# Patient Record
Sex: Male | Born: 1955
Health system: Southern US, Community
[De-identification: ages and names within clinical notes are randomized; demographics above are authoritative.]

## PROBLEM LIST (undated history)

## (undated) DIAGNOSIS — T7840XA Allergy, unspecified, initial encounter: Secondary | ICD-10-CM

## (undated) DIAGNOSIS — R42 Dizziness and giddiness: Secondary | ICD-10-CM

## (undated) HISTORY — PX: SCAR REVISION: SHX5285

## (undated) HISTORY — PX: TONSILLECTOMY: SUR1361

## (undated) HISTORY — DX: Allergy, unspecified, initial encounter: T78.40XA

---
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
## 2014-09-20 DIAGNOSIS — E66811 Obesity, class 1: Secondary | ICD-10-CM

## 2014-09-20 DIAGNOSIS — E669 Obesity, unspecified: Secondary | ICD-10-CM

## 2014-09-20 DIAGNOSIS — Z87828 Personal history of other (healed) physical injury and trauma: Secondary | ICD-10-CM

## 2014-09-20 HISTORY — DX: Obesity, class 1: E66.811

## 2014-09-20 HISTORY — DX: Personal history of other (healed) physical injury and trauma: Z87.828

## 2014-09-20 HISTORY — DX: Obesity, unspecified: E66.9

## 2015-10-04 DIAGNOSIS — E559 Vitamin D deficiency, unspecified: Secondary | ICD-10-CM

## 2015-10-04 HISTORY — DX: Vitamin D deficiency, unspecified: E55.9

## 2016-10-08 DIAGNOSIS — Z720 Tobacco use: Secondary | ICD-10-CM

## 2016-10-08 HISTORY — DX: Tobacco use: Z72.0

## 2016-10-09 DIAGNOSIS — Z79899 Other long term (current) drug therapy: Secondary | ICD-10-CM | POA: Insufficient documentation

## 2016-10-09 HISTORY — DX: Other long term (current) drug therapy: Z79.899

## 2019-03-15 LAB — HM COLONOSCOPY

## 2019-03-16 DIAGNOSIS — Z86010 Personal history of colonic polyps: Secondary | ICD-10-CM

## 2019-03-16 DIAGNOSIS — Z860101 Personal history of adenomatous and serrated colon polyps: Secondary | ICD-10-CM

## 2019-03-16 HISTORY — DX: Personal history of colonic polyps: Z86.010

## 2019-03-16 HISTORY — DX: Personal history of adenomatous and serrated colon polyps: Z86.0101

## 2019-06-27 ENCOUNTER — Encounter: Payer: Self-pay | Admitting: Family Medicine

## 2019-06-27 ENCOUNTER — Ambulatory Visit (INDEPENDENT_AMBULATORY_CARE_PROVIDER_SITE_OTHER): Payer: 59 | Admitting: Family Medicine

## 2019-06-27 ENCOUNTER — Other Ambulatory Visit: Payer: Self-pay

## 2019-06-27 VITALS — BP 111/77 | HR 84 | Ht 70.0 in | Wt 236.0 lb

## 2019-06-27 DIAGNOSIS — R7989 Other specified abnormal findings of blood chemistry: Secondary | ICD-10-CM

## 2019-06-27 DIAGNOSIS — Z720 Tobacco use: Secondary | ICD-10-CM

## 2019-06-27 DIAGNOSIS — E785 Hyperlipidemia, unspecified: Secondary | ICD-10-CM

## 2019-06-27 DIAGNOSIS — Z125 Encounter for screening for malignant neoplasm of prostate: Secondary | ICD-10-CM

## 2019-06-27 DIAGNOSIS — E559 Vitamin D deficiency, unspecified: Secondary | ICD-10-CM | POA: Diagnosis not present

## 2019-06-27 DIAGNOSIS — E782 Mixed hyperlipidemia: Secondary | ICD-10-CM | POA: Diagnosis not present

## 2019-06-27 DIAGNOSIS — Z86010 Personal history of colonic polyps: Secondary | ICD-10-CM | POA: Diagnosis not present

## 2019-06-27 DIAGNOSIS — Z87828 Personal history of other (healed) physical injury and trauma: Secondary | ICD-10-CM

## 2019-06-27 DIAGNOSIS — Z0000 Encounter for general adult medical examination without abnormal findings: Secondary | ICD-10-CM

## 2019-06-27 HISTORY — DX: Hyperlipidemia, unspecified: E78.5

## 2019-06-27 LAB — COMPLETE METABOLIC PANEL WITH GFR
AG Ratio: 1.8 (calc) (ref 1.0–2.5)
ALT: 18 U/L (ref 9–46)
AST: 13 U/L (ref 10–35)
Albumin: 4.4 g/dL (ref 3.6–5.1)
Alkaline phosphatase (APISO): 45 U/L (ref 35–144)
BUN: 14 mg/dL (ref 7–25)
CO2: 25 mmol/L (ref 20–32)
Calcium: 9.5 mg/dL (ref 8.6–10.3)
Chloride: 103 mmol/L (ref 98–110)
Creat: 0.78 mg/dL (ref 0.70–1.25)
GFR, Est African American: 112 mL/min/{1.73_m2} (ref >=60)
GFR, Est Non African American: 97 mL/min/{1.73_m2} (ref >=60)
Globulin: 2.4 g/dL (calc) (ref 1.9–3.7)
Glucose, Bld: 85 mg/dL (ref 65–99)
Potassium: 4.1 mmol/L (ref 3.5–5.3)
Sodium: 140 mmol/L (ref 135–146)
Total Bilirubin: 0.7 mg/dL (ref 0.2–1.2)
Total Protein: 6.8 g/dL (ref 6.1–8.1)

## 2019-06-27 LAB — CBC
HCT: 47 % (ref 38.5–50.0)
Hemoglobin: 16.1 g/dL (ref 13.2–17.1)
MCH: 29.6 pg (ref 27.0–33.0)
MCHC: 34.3 g/dL (ref 32.0–36.0)
MCV: 86.4 fL (ref 80.0–100.0)
MPV: 10.6 fL (ref 7.5–12.5)
Platelets: 189 10*3/uL (ref 140–400)
RBC: 5.44 10*6/uL (ref 4.20–5.80)
RDW: 13.1 % (ref 11.0–15.0)
WBC: 8.4 10*3/uL (ref 3.8–10.8)

## 2019-06-27 LAB — VITAMIN D 25 HYDROXY (VIT D DEFICIENCY, FRACTURES): Vit D, 25-Hydroxy: 33 ng/mL (ref 30–100)

## 2019-06-27 LAB — LIPID PANEL
Cholesterol: 192 mg/dL (ref <200)
HDL: 40 mg/dL (ref >=40)
LDL Cholesterol (Calc): 119 mg/dL (calc) — ABNORMAL HIGH
Non-HDL Cholesterol (Calc): 152 mg/dL (calc) — ABNORMAL HIGH (ref <130)
Total CHOL/HDL Ratio: 4.8 (calc) (ref <5.0)
Triglycerides: 213 mg/dL — ABNORMAL HIGH (ref <150)

## 2019-06-27 LAB — PSA: PSA: 0.4 ng/mL (ref <=4.0)

## 2019-06-27 NOTE — Assessment & Plan Note (Signed)
Continue work on [employer] and regular exercise.  Due to recheck lipids.  Currently on a statin and tolerating well.

## 2019-06-27 NOTE — Assessment & Plan Note (Signed)
He has been on vitamin D 2000 IU for almost a year.  He was previously on a lower dose but his vitamin D was not quite therapeutic.  He would like to have that rechecked.
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
## 2019-06-27 NOTE — Assessment & Plan Note (Signed)
Gets colonoscopies every 3 years with Dr. Sophronia Simas.

## 2019-06-27 NOTE — Assessment & Plan Note (Signed)
Still occasionally chews Nicorette gum but does not smoke and has not since 2012.

## 2019-06-28 ENCOUNTER — Encounter: Payer: Self-pay | Admitting: Family Medicine

## 2019-07-04 ENCOUNTER — Telehealth: Payer: Self-pay

## 2019-07-04 ENCOUNTER — Encounter: Payer: Self-pay | Admitting: Family Medicine

## 2019-07-04 ENCOUNTER — Ambulatory Visit (INDEPENDENT_AMBULATORY_CARE_PROVIDER_SITE_OTHER): Payer: 59 | Admitting: Family Medicine

## 2019-07-04 ENCOUNTER — Other Ambulatory Visit: Payer: Self-pay

## 2019-07-04 VITALS — BP 103/65 | HR 82 | Ht 70.0 in | Wt 234.0 lb

## 2019-07-04 DIAGNOSIS — Z0000 Encounter for general adult medical examination without abnormal findings: Secondary | ICD-10-CM | POA: Diagnosis not present

## 2019-07-04 DIAGNOSIS — Z1159 Encounter for screening for other viral diseases: Secondary | ICD-10-CM | POA: Diagnosis not present

## 2019-07-04 DIAGNOSIS — M6208 Separation of muscle (nontraumatic), other site: Secondary | ICD-10-CM

## 2019-07-04 DIAGNOSIS — Z0184 Encounter for antibody response examination: Secondary | ICD-10-CM | POA: Diagnosis not present

## 2019-07-04 DIAGNOSIS — Z298 Encounter for other specified prophylactic measures: Secondary | ICD-10-CM

## 2019-07-04 DIAGNOSIS — Z23 Encounter for immunization: Secondary | ICD-10-CM | POA: Diagnosis not present

## 2019-07-04 DIAGNOSIS — Z7184 Encounter for health counseling related to travel: Secondary | ICD-10-CM

## 2019-07-04 HISTORY — DX: Separation of muscle (nontraumatic), other site: M62.08

## 2019-07-04 MED ORDER — ATOVAQUONE-PROGUANIL HCL 62.5-25 MG PO TABS: 1.0000 | ORAL_TABLET | Freq: Every day | ORAL | 0 refills | Status: DC

## 2019-07-04 MED ORDER — TYPHOID VACCINE PO CPDR: 1.0000 | DELAYED_RELEASE_CAPSULE | ORAL | 0 refills | Status: DC

## 2019-07-04 NOTE — Patient Instructions (Signed)

## 2019-07-04 NOTE — Telephone Encounter (Signed)
The typhoid Vivotif is no longer available. They do have the typhoid Typhim. Pended order. It reads a 10 ml vial.
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
## 2019-07-05 ENCOUNTER — Encounter: Payer: Self-pay | Admitting: Family Medicine

## 2019-07-05 LAB — HIV ANTIBODY (ROUTINE TESTING W REFLEX): HIV 1&2 Ab, 4th Generation: NONREACTIVE

## 2019-07-05 LAB — HEPATITIS C ANTIBODY
Hepatitis C Ab: NONREACTIVE
SIGNAL TO CUT-OFF: 0.01 (ref <1.00)

## 2019-07-05 LAB — MEASLES/MUMPS/RUBELLA IMMUNITY
Mumps IgG: 228 AU/mL
Rubella: 27.6 Index
Rubeola IgG: >300 AU/mL

## 2019-07-05 LAB — VARICELLA ZOSTER ANTIBODY, IGG: Varicella IgG: 610.6 index

## 2019-07-05 MED ORDER — ATOVAQUONE-PROGUANIL HCL 250-100 MG PO TABS: ORAL_TABLET | ORAL | 0 refills | Status: DC

## 2019-07-05 MED FILL — ATOVAQUONE-PROGUANIL 250-10: 250-100 | 20 days supply | Qty: 20 | Fill #0

## 2019-07-05 NOTE — Telephone Encounter (Signed)
Called and spoke with Romeo Apple at pharmacy, they have correct dose and medication. No further needs.

## 2019-07-05 NOTE — Telephone Encounter (Signed)
Please see pended rx. This is the only choice I see from the list of options.

## 2019-07-05 NOTE — Telephone Encounter (Signed)
The pharmacy would like to switch the Malarone to the adult dose of 250/100 mg.   Disregard first message.

## 2019-07-11 ENCOUNTER — Other Ambulatory Visit: Payer: Self-pay

## 2019-07-11 ENCOUNTER — Ambulatory Visit (INDEPENDENT_AMBULATORY_CARE_PROVIDER_SITE_OTHER): Payer: 59 | Admitting: Physician Assistant

## 2019-07-11 VITALS — Temp 98.9°F

## 2019-07-11 DIAGNOSIS — Z23 Encounter for immunization: Secondary | ICD-10-CM | POA: Diagnosis not present

## 2019-07-11 NOTE — Progress Notes (Signed)
Established Patient Office Visit  Subjective:  Patient ID: Luis Mullins, male    DOB: [birth year]  Age: 64 y.o. MRN: [id number]  CC:  Chief Complaint  Patient presents with  . Immunizations    HPI Kasper Mudrick presents for TwinRix injection.  History reviewed. No pertinent past medical history.  Past Surgical History:  Procedure Laterality Date  . APPENDECTOMY  1975  . SCAR REVISION    . TONSILLECTOMY     age 56    Family History  Problem Relation Age of Onset  . Hypercholesterolemia Mother   . Hypercholesterolemia Father   . Heart attack Maternal Grandfather   . Prostate cancer Paternal Uncle     Social History   Socioeconomic History  . Marital status: Married    Spouse name: Juliann Pulse  . Number of children: Not on file  . Years of education: Not on file  . Highest education level: Not on file  Occupational History  . Occupation: retired  Tobacco Use  . Smoking status: Former Smoker    Types: Cigarettes    Quit date: 01/14/2019    Years since quitting: 0.4  . Smokeless tobacco: Never Used  Vaping Use  . Vaping Use: Never used  Substance and Sexual Activity  . Alcohol use: Never  . Drug use: Never  . Sexual activity: Yes    Partners: Female    Birth control/protection: None  Other Topics Concern  . Not on file  Social History Narrative   Retired.   Social Determinants of Health   Financial Resource Strain:   . Difficulty of Paying Living Expenses:   Food Insecurity: No Food Insecurity  . Worried About Charity fundraiser in the Last Year: Never true  . Ran Out of Food in the Last Year: Never true  Transportation Needs: No Transportation Needs  . Lack of Transportation (Medical): No  . Lack of Transportation (Non-Medical): No  Physical Activity: Insufficiently Active  . Days of Exercise per Week: 1 day  . Minutes of Exercise per Session: 10 min  Stress:   . Feeling of Stress :   Social Connections: Unknown  . Frequency of Communication  with Friends and Family: Not on file  . Frequency of Social Gatherings with Friends and Family: Not on file  . Attends Religious Services: Not on file  . Active Member of Clubs or Organizations: Not on file  . Attends Archivist Meetings: Not on file  . Marital Status: Married  Human resources officer Violence: Not At Risk  . Fear of Current or Ex-Partner: No  . Emotionally Abused: No  . Physically Abused: No  . Sexually Abused: No    Outpatient Medications Prior to Visit  Medication Sig Dispense Refill  . Cholecalciferol 25 MCG (1000 UT) capsule Take 2,000 Units by mouth daily.    . nicotine polacrilex (NICORETTE) 2 MG gum Take by mouth as needed.    . sildenafil (REVATIO) 20 MG tablet Take 2-3 tablets 30-60 minutes prior to sexual activity.    . simvastatin (ZOCOR) 5 MG tablet Take 1 tablet by mouth at bedtime.    Marland Kitchen atovaquone-proguanil (MALARONE) 250-100 MG TABS tablet Take 1 tablet by mouth daily. Start medication 1 to 2 days before exposure/travel. Take 1 tab daily. Continue for 7 days after you return to the [place] (Patient not taking: Reported on 07/11/2019) 20 tablet 0   No facility-administered medications prior to visit.    Allergies  Allergen Reactions  . Bee Venom  ROS Review of Systems    Objective:    Physical Exam  Temp 98.9 F (37.2 C) (Oral)  Wt Readings from Last 3 Encounters:  07/04/19 234 lb (106.1 kg)  06/27/19 236 lb (107 kg)     There are no preventive care reminders to display for this patient.  There are no preventive care reminders to display for this patient.  No results found for: TSH Lab Results  Component Value Date   WBC 8.4 06/27/2019   HGB 16.1 06/27/2019   HCT 47.0 06/27/2019   MCV 86.4 06/27/2019   PLT 189 06/27/2019   Lab Results  Component Value Date   NA 140 06/27/2019   K 4.1 06/27/2019   CO2 25 06/27/2019   GLUCOSE 85 06/27/2019   BUN 14 06/27/2019   CREATININE 0.78 06/27/2019   BILITOT 0.7 06/27/2019   AST 13  06/27/2019   ALT 18 06/27/2019   PROT 6.8 06/27/2019   CALCIUM 9.5 06/27/2019   Lab Results  Component Value Date   CHOL 192 06/27/2019   Lab Results  Component Value Date   HDL 40 06/27/2019   Lab Results  Component Value Date   LDLCALC 119 (H) 06/27/2019   Lab Results  Component Value Date   TRIG 213 (H) 06/27/2019   Lab Results  Component Value Date   CHOLHDL 4.8 06/27/2019   No results found for: HGBA1C    Assessment & Plan:  TwinRix vaccine - Patient tolerated injection well without complications. Patient advised to schedule next injection 14 days from today. TwinRix schedule - Days 0, 7, and 21 to 30 followed by a booster dose at Month 12 may be used.    Problem List Items Addressed This Visit    None    Visit Diagnoses    Need for prophylactic vaccination against hepatitis A and hepatitis B in adult    -  Primary   Relevant Orders   Hepatitis A hepatitis B combined vaccine IM (Completed)      No orders of the defined types were placed in this encounter.   Follow-up: Return in about 2 weeks (around 07/25/2019) for Twinrix injection. Earna Coder, Janalyn Harder, CMA

## 2019-07-11 NOTE — Progress Notes (Signed)
Patient ID: Luis Mullins, male   DOB: Sep 09, 1955, 64 y.o.   MRN: [id number] Agree with above plan.

## 2019-07-25 ENCOUNTER — Other Ambulatory Visit: Payer: Self-pay

## 2019-07-25 ENCOUNTER — Ambulatory Visit (INDEPENDENT_AMBULATORY_CARE_PROVIDER_SITE_OTHER): Payer: 59 | Admitting: Family Medicine

## 2019-07-25 ENCOUNTER — Encounter: Payer: Self-pay | Admitting: Family Medicine

## 2019-07-25 ENCOUNTER — Encounter: Payer: Self-pay | Admitting: Physician Assistant

## 2019-07-25 VITALS — Temp 97.0°F

## 2019-07-25 DIAGNOSIS — Z23 Encounter for immunization: Secondary | ICD-10-CM

## 2019-07-25 NOTE — Progress Notes (Signed)
TwinRix vaccine - Patient tolerated injection given in Left left deltoid  well without complications.

## 2019-07-25 NOTE — Progress Notes (Signed)
Agree with documentation as above.   Saarah Dewing, MD  

## 2019-07-26 NOTE — Telephone Encounter (Signed)
Would this be something he could get at the health department?

## 2019-07-28 ENCOUNTER — Encounter: Payer: 59 | Admitting: Family Medicine

## 2019-08-06 ENCOUNTER — Encounter: Payer: Self-pay | Admitting: Family Medicine

## 2019-08-10 NOTE — Telephone Encounter (Signed)
Immunization record updated in pt's chart.

## 2019-08-17 DIAGNOSIS — Z20822 Contact with and (suspected) exposure to covid-19: Secondary | ICD-10-CM | POA: Diagnosis not present

## 2019-09-01 DIAGNOSIS — Z20822 Contact with and (suspected) exposure to covid-19: Secondary | ICD-10-CM | POA: Diagnosis not present

## 2019-10-24 ENCOUNTER — Other Ambulatory Visit: Payer: Self-pay

## 2019-10-24 ENCOUNTER — Ambulatory Visit (INDEPENDENT_AMBULATORY_CARE_PROVIDER_SITE_OTHER): Payer: 59 | Admitting: Family Medicine

## 2019-10-24 VITALS — BP 107/72 | HR 76

## 2019-10-24 DIAGNOSIS — Z23 Encounter for immunization: Secondary | ICD-10-CM

## 2019-10-24 NOTE — Progress Notes (Signed)
Patient is here for a flu vaccine. Verified no previous allergy to flu vaccine, eggs, or latex. Flu injection to right deltoid with no apparent complications. Patient advised to call with any problems.   

## 2019-12-06 ENCOUNTER — Encounter: Payer: Self-pay | Admitting: Family Medicine

## 2019-12-06 DIAGNOSIS — E782 Mixed hyperlipidemia: Secondary | ICD-10-CM

## 2019-12-07 ENCOUNTER — Other Ambulatory Visit: Payer: Self-pay | Admitting: Family Medicine

## 2019-12-07 MED ORDER — SIMVASTATIN 5 MG PO TABS: 5.0000 mg | ORAL_TABLET | Freq: Every day | ORAL | 2 refills | Status: DC

## 2019-12-07 MED FILL — SIMVASTATIN 5 MG TABS: 5 | 90 days supply | Qty: 90 | Fill #0

## 2019-12-07 NOTE — Telephone Encounter (Signed)
rx sent

## 2019-12-26 ENCOUNTER — Encounter: Payer: Self-pay | Admitting: Physician Assistant

## 2020-01-09 ENCOUNTER — Telehealth: Payer: Self-pay | Admitting: Family Medicine

## 2020-01-09 MED ORDER — PREDNISONE 20 MG PO TABS: 40.0000 mg | ORAL_TABLET | Freq: Every day | ORAL | 0 refills | Status: DC

## 2020-01-09 MED ORDER — CYCLOBENZAPRINE HCL 10 MG PO TABS: 10.0000 mg | ORAL_TABLET | Freq: Three times a day (TID) | ORAL | 0 refills | Status: DC | PRN

## 2020-01-09 NOTE — Telephone Encounter (Signed)
Pulled out his back while traveling to [place].  Low back hurting.  He is out of out town. rx for steroid and muscle relaxer sent in. If not better next week please make appt for low back pain.

## 2020-01-23 ENCOUNTER — Encounter: Payer: Self-pay | Admitting: Family Medicine

## 2020-01-23 ENCOUNTER — Ambulatory Visit (INDEPENDENT_AMBULATORY_CARE_PROVIDER_SITE_OTHER): Payer: 59 | Admitting: Family Medicine

## 2020-01-23 ENCOUNTER — Other Ambulatory Visit: Payer: Self-pay

## 2020-01-23 ENCOUNTER — Ambulatory Visit (INDEPENDENT_AMBULATORY_CARE_PROVIDER_SITE_OTHER): Payer: 59

## 2020-01-23 VITALS — BP 119/84 | HR 102 | Ht 70.0 in | Wt 246.0 lb

## 2020-01-23 DIAGNOSIS — M25512 Pain in left shoulder: Secondary | ICD-10-CM

## 2020-01-23 DIAGNOSIS — M19012 Primary osteoarthritis, left shoulder: Secondary | ICD-10-CM | POA: Diagnosis not present

## 2020-01-23 DIAGNOSIS — M545 Low back pain, unspecified: Secondary | ICD-10-CM

## 2020-01-23 DIAGNOSIS — R0683 Snoring: Secondary | ICD-10-CM

## 2020-01-23 HISTORY — DX: Low back pain, unspecified: M54.50

## 2020-01-23 HISTORY — DX: Pain in left shoulder: M25.512

## 2020-01-23 HISTORY — DX: Snoring: R06.83

## 2020-01-23 NOTE — Assessment & Plan Note (Signed)
Has been recurrent for years he had an old injury about 25 years ago lifting heavy dye cast.  He says his last flare when he was traveling is about 90 for 5% improved.  He is doing better.  He actually points to the right SI joint area as his area of localized pain.

## 2020-01-23 NOTE — Assessment & Plan Note (Signed)
Suspect bursitis or hooked acromion causing pain with extremes of range of motion though he does have normal range of motion and strength which is great.  Given exercises to do on his own at home.  We will go ahead and get x-ray today since symptoms have been present for at least 6 months.

## 2020-01-23 NOTE — Progress Notes (Signed)
Established Patient Office Visit  Subjective:  Patient ID: Luis Mullins, male    DOB: [birth year]  Age: 64 y.o. MRN: [id number]  CC:  Chief Complaint  Patient presents with  . Snoring       . Shoulder Pain    HPI Luis Mullins presents for back pain.  He had called in December while traveling reporting that he had pulled out his back.  We did send Flexeril and prednisone.  He says it did help in fact he is about 95% better but he does have a couple other concerns today.  Wife reports that he has been snoring.  He does snore loudly he has had witnessed apnea and he is over 45 years old.  He also reports left shoulder pain x 6 months.   History reviewed. No pertinent past medical history.  Past Surgical History:  Procedure Laterality Date  . APPENDECTOMY  1975  . SCAR REVISION    . TONSILLECTOMY     age 43    Family History  Problem Relation Age of Onset  . Hypercholesterolemia Mother   . Hypercholesterolemia Father   . Heart attack Maternal Grandfather   . Prostate cancer Paternal Uncle     Social History   Socioeconomic History  . Marital status: Married    Spouse name: [name]  . Number of children: Not on file  . Years of education: Not on file  . Highest education level: Not on file  Occupational History  . Occupation: retired  Tobacco Use  . Smoking status: Former Smoker    Types: Cigarettes    Quit date: 01/14/2019    Years since quitting: 1.0  . Smokeless tobacco: Never Used  Vaping Use  . Vaping Use: Never used  Substance and Sexual Activity  . Alcohol use: Never  . Drug use: Never  . Sexual activity: Yes    Partners: Female    Birth control/protection: None  Other Topics Concern  . Not on file  Social History Narrative   Retired.   Social Determinants of Health   Financial Resource Strain: Not on file  Food Insecurity: No Food Insecurity  . Worried About Programme researcher, broadcasting/film/video in the Last Year: Never true  . Ran Out of Food in the Last  Year: Never true  Transportation Needs: No Transportation Needs  . Lack of Transportation (Medical): No  . Lack of Transportation (Non-Medical): No  Physical Activity: Insufficiently Active  . Days of Exercise per Week: 1 day  . Minutes of Exercise per Session: 10 min  Stress: Not on file  Social Connections: Unknown  . Frequency of Communication with Friends and Family: Not on file  . Frequency of Social Gatherings with Friends and Family: Not on file  . Attends Religious Services: Not on file  . Active Member of Clubs or Organizations: Not on file  . Attends Banker Meetings: Not on file  . Marital Status: Married  Catering manager Violence: Not At Risk  . Fear of Current or Ex-Partner: No  . Emotionally Abused: No  . Physically Abused: No  . Sexually Abused: No    Outpatient Medications Prior to Visit  Medication Sig Dispense Refill  . Cholecalciferol 25 MCG (1000 UT) capsule Take 2,000 Units by mouth daily.    . nicotine polacrilex (NICORETTE) 2 MG gum Take by mouth as needed.    . sildenafil (REVATIO) 20 MG tablet Take 2-3 tablets 30-60 minutes prior to sexual activity.    . simvastatin (  ZOCOR) 5 MG tablet Take 1 tablet (5 mg total) by mouth at bedtime. 90 tablet 2  . cyclobenzaprine (FLEXERIL) 10 MG tablet Take 1 tablet (10 mg total) by mouth 3 (three) times daily as needed for muscle spasms. 30 tablet 0  . predniSONE (DELTASONE) 20 MG tablet Take 2 tablets (40 mg total) by mouth daily with breakfast. 10 tablet 0   No facility-administered medications prior to visit.    Allergies  Allergen Reactions  . Bee Venom     ROS Review of Systems    Objective:    Physical Exam Vitals reviewed.  Constitutional:      Appearance: He is well-developed and well-nourished.  HENT:     Head: Normocephalic and atraumatic.  Eyes:     Extraocular Movements: EOM normal.     Conjunctiva/sclera: Conjunctivae normal.  Cardiovascular:     Rate and Rhythm: Normal rate.   Pulmonary:     Effort: Pulmonary effort is normal.  Musculoskeletal:     Comments: Left shoulder is nontender.  Normal range of motion.  Negative empty can test.  Slightly decreased external rotation on the left compared to the right.  Skin:    General: Skin is dry.     Coloration: Skin is not pale.  Neurological:     Mental Status: He is alert and oriented to person, place, and time.  Psychiatric:        Mood and Affect: Mood and affect normal.        Behavior: Behavior normal.     BP 119/84   Pulse (!) 102   Ht 5\' 10"  (1.778 m)   Wt 246 lb (111.6 kg)   SpO2 94%   BMI 35.30 kg/m  Wt Readings from Last 3 Encounters:  01/23/20 246 lb (111.6 kg)  07/04/19 234 lb (106.1 kg)  06/27/19 236 lb (107 kg)     There are no preventive care reminders to display for this patient.  There are no preventive care reminders to display for this patient.  No results found for: TSH Lab Results  Component Value Date   WBC 8.4 06/27/2019   HGB 16.1 06/27/2019   HCT 47.0 06/27/2019   MCV 86.4 06/27/2019   PLT 189 06/27/2019   Lab Results  Component Value Date   NA 140 06/27/2019   K 4.1 06/27/2019   CO2 25 06/27/2019   GLUCOSE 85 06/27/2019   BUN 14 06/27/2019   CREATININE 0.78 06/27/2019   BILITOT 0.7 06/27/2019   AST 13 06/27/2019   ALT 18 06/27/2019   PROT 6.8 06/27/2019   CALCIUM 9.5 06/27/2019   Lab Results  Component Value Date   CHOL 192 06/27/2019   Lab Results  Component Value Date   HDL 40 06/27/2019   Lab Results  Component Value Date   LDLCALC 119 (H) 06/27/2019   Lab Results  Component Value Date   TRIG 213 (H) 06/27/2019   Lab Results  Component Value Date   CHOLHDL 4.8 06/27/2019   No results found for: HGBA1C    Assessment & Plan:   Problem List Items Addressed This Visit      Other   Snoring    Stop bang score of 5 today suspicious enough that we do need to test him for obstructive sleep apnea did warn about potential side effects and  long-term effects of having untreated sleep apnea.  Discussed options including home test versus in lab study.  He would prefer to do an in lab  study.  Referral placed.      Relevant Orders   Split night study   Acute right-sided low back pain without sciatica    Has been recurrent for years he had an old injury about 25 years ago lifting heavy dye cast.  He says his last flare when he was traveling is about 90 for 5% improved.  He is doing better.  He actually points to the right SI joint area as his area of localized pain.      Acute pain of left shoulder - Primary    Suspect bursitis or hooked acromion causing pain with extremes of range of motion though he does have normal range of motion and strength which is great.  Given exercises to do on his own at home.  We will go ahead and get x-ray today since symptoms have been present for at least 6 months.      Relevant Orders   DG Shoulder Left      No orders of the defined types were placed in this encounter.   Follow-up: No follow-ups on file.    Nani Gasser, MD

## 2020-01-23 NOTE — Assessment & Plan Note (Signed)
Stop bang score of 5 today suspicious enough that we do need to test him for obstructive sleep apnea did warn about potential side effects and long-term effects of having untreated sleep apnea.  Discussed options including home test versus in lab study.  He would prefer to do an in lab study.  Referral placed.

## 2020-03-07 ENCOUNTER — Other Ambulatory Visit: Payer: Self-pay

## 2020-03-07 ENCOUNTER — Ambulatory Visit (HOSPITAL_BASED_OUTPATIENT_CLINIC_OR_DEPARTMENT_OTHER): Payer: 59 | Attending: Family Medicine | Admitting: Internal Medicine

## 2020-03-07 VITALS — Ht 70.0 in | Wt 235.0 lb

## 2020-03-07 DIAGNOSIS — R0683 Snoring: Secondary | ICD-10-CM | POA: Diagnosis present

## 2020-03-07 DIAGNOSIS — G4761 Periodic limb movement disorder: Secondary | ICD-10-CM | POA: Diagnosis not present

## 2020-03-07 DIAGNOSIS — G4733 Obstructive sleep apnea (adult) (pediatric): Secondary | ICD-10-CM | POA: Insufficient documentation

## 2020-03-11 DIAGNOSIS — R0683 Snoring: Secondary | ICD-10-CM | POA: Diagnosis not present

## 2020-03-11 NOTE — Procedures (Signed)
    Patient Name: Luis Mullins, Luis Mullins Date: 03/07/2020 Gender: Male D.O.B: [birth year] Age (years): 64 Referring Provider: Nani Gasser Height (inches): 70 Interpreting Physician: Jetty Duhamel MD, ABSM Weight (lbs): 235 RPSGT: Shelah Lewandowsky BMI: 34 MRN: [id number] Neck Size: 18.50  CLINICAL INFORMATION Sleep Study Type: NPSG Indication for sleep study: Obesity, Snoring, Witnessed Apneas Epworth Sleepiness Score: 3  SLEEP STUDY TECHNIQUE As per the AASM Manual for the Scoring of Sleep and Associated Events v2.3 (April 2016) with a hypopnea requiring 4% desaturations.  The channels recorded and monitored were frontal, central and occipital EEG, electrooculogram (EOG), submentalis EMG (chin), nasal and oral airflow, thoracic and abdominal wall motion, anterior tibialis EMG, snore microphone, electrocardiogram, and pulse oximetry.  MEDICATIONS Medications self-administered by patient taken the night of the study : none reported  SLEEP ARCHITECTURE The study was initiated at 10:43:15 PM and ended at 5:07:19 AM.  Sleep onset time was 25.8 minutes and the sleep efficiency was 79.9%%. The total sleep time was 307 minutes.  Stage REM latency was 149.0 minutes.  The patient spent 3.4%% of the night in stage N1 sleep, 78.8%% in stage N2 sleep, 0.2%% in stage N3 and 17.6% in REM.  Alpha intrusion was absent.  Supine sleep was 29.86%.  RESPIRATORY PARAMETERS The overall apnea/hypopnea index (AHI) was 4.1 per hour. There were 0 total apneas, including 0 obstructive, 0 central and 0 mixed apneas. There were 21 hypopneas and 10 RERAs.  The AHI during Stage REM sleep was 4.4 per hour.  AHI while supine was 10.5 per hour.  The mean oxygen saturation was 92.3%. The minimum SpO2 during sleep was 88.0%.  moderate snoring was noted during this study.  CARDIAC DATA The 2 lead EKG demonstrated sinus rhythm. The mean heart rate was 76.5 beats per minute. Other EKG findings  include: None.  LEG MOVEMENT DATA The total PLMS were 0 with a resulting PLMS index of 0.0. Associated arousal with leg movement index was 2.3 .  IMPRESSIONS - Obstructive sleep apnea events occurred during this study, within normal limits (AHI = 4.1/h). - No significant central sleep apnea occurred during this study (CAI = 0.0/h). - The patient had mild oxygen desaturation during the study (Min O2 = 88.0%) Mean O2 saturation 92.4%. - The patient snored with moderate snoring volume. - No cardiac abnormalities were noted during this study. - Limb Movement sleep disturbance was noted with total 765 limb movements (149.5/ hr). 12 (2.3/ hr) were associated with arousal or awakening.  R>L leg. DIAGNOSIS - Limb Movement Sleep Disorder  RECOMMENDATIONS - Consider therapeutic trial of Requip, Mirapex or carbidopa-levodopa for limb movement. - Sleep hygiene should be reviewed to assess factors that may improve sleep quality. - Weight management and regular exercise should be initiated or continued if appropriate.  [Electronically signed] 03/11/2020 11:45 AM  Jetty Duhamel MD, ABSM Diplomate, American Board of Sleep Medicine   NPI: [id number]                         Jetty Duhamel Diplomate, American Board of Sleep Medicine  ELECTRONICALLY SIGNED ON:  03/11/2020, 11:39 AM [facility] PH: [phone]   FX: [phone] ACCREDITED BY THE AMERICAN ACADEMY OF SLEEP MEDICINE

## 2020-04-02 MED FILL — SIMVASTATIN 5 MG TABS: 5 | 90 days supply | Qty: 90 | Fill #1

## 2020-06-20 ENCOUNTER — Encounter: Payer: Self-pay | Admitting: Family Medicine

## 2020-06-21 ENCOUNTER — Other Ambulatory Visit: Payer: Self-pay

## 2020-06-21 NOTE — Telephone Encounter (Signed)
Left voicemail for patient to call back to get appointment scheduled. AM 

## 2020-06-21 NOTE — Telephone Encounter (Signed)
Patient scheduled.

## 2020-06-22 ENCOUNTER — Encounter: Payer: Self-pay | Admitting: Family Medicine

## 2020-06-22 ENCOUNTER — Ambulatory Visit (INDEPENDENT_AMBULATORY_CARE_PROVIDER_SITE_OTHER): Payer: 59 | Admitting: Family Medicine

## 2020-06-22 ENCOUNTER — Other Ambulatory Visit: Payer: Self-pay

## 2020-06-22 VITALS — BP 120/83 | HR 90 | Temp 98.0°F | Resp 16

## 2020-06-22 DIAGNOSIS — N649 Disorder of breast, unspecified: Secondary | ICD-10-CM

## 2020-06-22 DIAGNOSIS — L918 Other hypertrophic disorders of the skin: Secondary | ICD-10-CM

## 2020-06-22 NOTE — Progress Notes (Signed)
Acute Office Visit  Subjective:    Patient ID: Luis Mullins, male    DOB: January 04, 1956, 65 y.o.   MRN: [id number]  Chief Complaint  Patient presents with   skin lesion    HPI Patient is in today for skin lesion.  Patient states about 4 months ago he noticed what he thought was a scab to his left areola but it never healed. Area is now pea-size, "bumpy," tan/brown, and mildly itchy. He denies any pain, rashes, discharge, or changes to lesion. No systemic symptoms. Requesting dermatology referral.  Also points out 4 small skin tags to left neck base at shirt collar that he would like removed today. Denies any symptoms of infection.       History reviewed. No pertinent past medical history.  Past Surgical History:  Procedure Laterality Date   APPENDECTOMY  77   SCAR REVISION     TONSILLECTOMY     age 38    Family History  Problem Relation Age of Onset   Hypercholesterolemia Mother    Hypercholesterolemia Father    Heart attack Maternal Grandfather    Prostate cancer Paternal Uncle     Social History   Socioeconomic History   Marital status: Married    Spouse name: [name]   Number of children: Not on file   Years of education: Not on file   Highest education level: Not on file  Occupational History   Occupation: retired  Tobacco Use   Smoking status: Former    Pack years: 0.00    Types: Cigarettes    Quit date: 01/14/2019    Years since quitting: 1.4   Smokeless tobacco: Never  Vaping Use   Vaping Use: Never used  Substance and Sexual Activity   Alcohol use: Never   Drug use: Never   Sexual activity: Yes    Partners: Female    Birth control/protection: None  Other Topics Concern   Not on file  Social History Narrative   Retired.   Social Determinants of Health   Financial Resource Strain: Not on file  Food Insecurity: No Food Insecurity   Worried About Programme researcher, broadcasting/film/video in the Last Year: Never true   Ran Out of Food in the Last Year: Never  true  Transportation Needs: No Transportation Needs   Lack of Transportation (Medical): No   Lack of Transportation (Non-Medical): No  Physical Activity: Insufficiently Active   Days of Exercise per Week: 1 day   Minutes of Exercise per Session: 10 min  Stress: Not on file  Social Connections: Unknown   Frequency of Communication with Friends and Family: Not on file   Frequency of Social Gatherings with Friends and Family: Not on file   Attends Religious Services: Not on file   Active Member of Clubs or Organizations: Not on file   Attends Banker Meetings: Not on file   Marital Status: Married  Catering manager Violence: Not At Risk   Fear of Current or Ex-Partner: No   Emotionally Abused: No   Physically Abused: No   Sexually Abused: No    Outpatient Medications Prior to Visit  Medication Sig Dispense Refill   Cholecalciferol 25 MCG (1000 UT) capsule Take 2,000 Units by mouth daily.     nicotine polacrilex (NICORETTE) 2 MG gum Take by mouth as needed.     sildenafil (REVATIO) 20 MG tablet Take 2-3 tablets 30-60 minutes prior to sexual activity.     simvastatin (ZOCOR) 5 MG tablet TAKE 1  TABLET (5 MG TOTAL) BY MOUTH AT BEDTIME. 90 tablet 2   No facility-administered medications prior to visit.    Allergies  Allergen Reactions   Bee Venom     Review of Systems All review of systems negative except what is listed in the HPI     Objective:    Physical Exam Constitutional:      Appearance: Normal appearance.  Skin:    General: Skin is warm and dry.     Findings: Lesion present. No rash.     Comments: Left areola with pea-size tan/brown lesion with irregular borders/surface, no breast tissue changes or discharge; 4 small skin tags to left neck base at shirt collar, no erythema, edema, drainage, warmth  Neurological:     Mental Status: He is alert and oriented to person, place, and time.  Psychiatric:        Mood and Affect: Mood normal.        Behavior:  Behavior normal.        Thought Content: Thought content normal.        Judgment: Judgment normal.     BP 120/83   Pulse 90   Temp 98 F (36.7 C)   Resp 16   SpO2 96%  Wt Readings from Last 3 Encounters:  03/07/20 235 lb (106.6 kg)  01/23/20 246 lb (111.6 kg)  07/04/19 234 lb (106.1 kg)    Health Maintenance Due  Topic Date Due   Pneumococcal Vaccine 72-13 Years old (1 - PCV) Never done   Zoster Vaccines- Shingrix (1 of 2) Never done   COVID-19 Vaccine (4 - Booster for Moderna series) 02/22/2020    There are no preventive care reminders to display for this patient.   No results found for: TSH Lab Results  Component Value Date   WBC 8.4 06/27/2019   HGB 16.1 06/27/2019   HCT 47.0 06/27/2019   MCV 86.4 06/27/2019   PLT 189 06/27/2019   Lab Results  Component Value Date   NA 140 06/27/2019   K 4.1 06/27/2019   CO2 25 06/27/2019   GLUCOSE 85 06/27/2019   BUN 14 06/27/2019   CREATININE 0.78 06/27/2019   BILITOT 0.7 06/27/2019   AST 13 06/27/2019   ALT 18 06/27/2019   PROT 6.8 06/27/2019   CALCIUM 9.5 06/27/2019   Lab Results  Component Value Date   CHOL 192 06/27/2019   Lab Results  Component Value Date   HDL 40 06/27/2019   Lab Results  Component Value Date   LDLCALC 119 (H) 06/27/2019   Lab Results  Component Value Date   TRIG 213 (H) 06/27/2019   Lab Results  Component Value Date   CHOLHDL 4.8 06/27/2019   No results found for: HGBA1C     Assessment & Plan:   1. Lesion of left nipple Uncertain diagnosis. No acute signs of infection. Referral to dermatology for eval and potential biopsy/removal given location.  - Ambulatory referral to Dermatology   2. Skin tag Procedure: Skin tag removal Informed consent:  Discussed risks (permanent scarring, infection, pain, bleeding, bruising, redness, and recurrence of the lesion) and benefits of the procedure, as well as the alternatives.  He is aware that skin tags are benign lesions, and their  removal is often not considered medically necessary.  Informed consent was obtained. Anesthesia: cold spray The area was prepared and draped in a standard fashion. Snip removal was performed.   Antibiotic ointment and a sterile dressing were applied.   The patient  tolerated procedure well. The patient was instructed on post-op care.   Number of lesions removed:  4  Patient aware of signs/symptoms requiring further/urgent evaluation.  Follow-up as needed.   Clayborne Dana, NP

## 2020-07-09 ENCOUNTER — Ambulatory Visit (INDEPENDENT_AMBULATORY_CARE_PROVIDER_SITE_OTHER): Payer: 59 | Admitting: Family Medicine

## 2020-07-09 ENCOUNTER — Other Ambulatory Visit: Payer: Self-pay

## 2020-07-09 ENCOUNTER — Encounter: Payer: Self-pay | Admitting: Family Medicine

## 2020-07-09 VITALS — BP 126/70 | HR 85 | Ht 70.0 in | Wt 249.0 lb

## 2020-07-09 DIAGNOSIS — Z23 Encounter for immunization: Secondary | ICD-10-CM

## 2020-07-09 DIAGNOSIS — Z0000 Encounter for general adult medical examination without abnormal findings: Secondary | ICD-10-CM

## 2020-07-09 DIAGNOSIS — R21 Rash and other nonspecific skin eruption: Secondary | ICD-10-CM | POA: Diagnosis not present

## 2020-07-09 DIAGNOSIS — M25512 Pain in left shoulder: Secondary | ICD-10-CM

## 2020-07-09 NOTE — Progress Notes (Addendum)
CPE  Established Patient Office Visit  Subjective:  Patient ID: Luis Mullins, male    DOB: 05-06-1955  Age: 65 y.o. MRN: [id number]  CC:  Chief Complaint  Patient presents with   Annual Exam         HPI Kross Swallows presents for CPE.  Doing well overall.  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  He will need a malaria prescription at that time.  He also reports a rash on his mid chest area that is a little bit red.  He says its been there for probably about a year and has not really gotten worse but is also not resolved.  He is not been treating it with anything its not painful itchy or irritated.  He also complains of persistent left shoulder pain he says now he starting to feel it in his right shoulder as well and wonders if it could be his statin.  He also has a lesion on his left nipple he is already made an appoint with dermatology he says is not painful or bothersome but did want it looked at.  History reviewed. No pertinent past medical history.  Past Surgical History:  Procedure Laterality Date   APPENDECTOMY  81   SCAR REVISION     TONSILLECTOMY     age 72    Family History  Problem Relation Age of Onset   Hypercholesterolemia Mother    Hypercholesterolemia Father    Heart attack Maternal Grandfather    Prostate cancer Paternal Uncle     Social History   Socioeconomic History   Marital status: Married    Spouse name: [name]   Number of children: Not on file   Years of education: Not on file   Highest education level: Not on file  Occupational History   Occupation: retired  Tobacco Use   Smoking status: Former    Pack years: 0.00    Types: Cigarettes    Quit date: 01/14/2019    Years since quitting: 1.4   Smokeless tobacco: Never  Vaping Use   Vaping Use: Never used  Substance and Sexual Activity   Alcohol use: Never   Drug use: Never   Sexual activity: Yes     Partners: Female    Birth control/protection: None  Other Topics Concern   Not on file  Social History Narrative   Retired.   Social Determinants of Health   Financial Resource Strain: Not on file  Food Insecurity: Not on file  Transportation Needs: Not on file  Physical Activity: Not on file  Stress: Not on file  Social Connections: Not on file  Intimate Partner Violence: Not on file    Outpatient Medications Prior to Visit  Medication Sig Dispense Refill   Cholecalciferol 25 MCG (1000 UT) capsule Take 2,000 Units by mouth daily.     nicotine polacrilex (NICORETTE) 2 MG gum Take by mouth as needed.     sildenafil (REVATIO) 20 MG tablet Take 2-3 tablets 30-60 minutes prior to sexual activity.     simvastatin (ZOCOR) 5 MG tablet TAKE 1 TABLET (5 MG TOTAL) BY MOUTH AT BEDTIME. 90 tablet 2   No facility-administered medications prior to visit.    Allergies  Allergen Reactions   Bee Venom     ROS Review of Systems    Objective:    Physical Exam Constitutional:      Appearance: He is  well-developed.  HENT:     Head: Normocephalic and atraumatic.     Right Ear: External ear normal.     Left Ear: External ear normal.     Nose: Nose normal.  Eyes:     Conjunctiva/sclera: Conjunctivae normal.     Pupils: Pupils are equal, round, and reactive to light.  Neck:     Thyroid: No thyromegaly.  Cardiovascular:     Rate and Rhythm: Normal rate and regular rhythm.     Heart sounds: Normal heart sounds.  Pulmonary:     Effort: Pulmonary effort is normal.     Breath sounds: Normal breath sounds.  Abdominal:     General: Bowel sounds are normal. There is no distension.     Palpations: Abdomen is soft. There is no mass.     Tenderness: There is no abdominal tenderness. There is no guarding or rebound.  Musculoskeletal:        General: Normal range of motion.     Cervical back: Normal range of motion and neck supple.  Lymphadenopathy:     Cervical: No cervical adenopathy.   Skin:    General: Skin is warm and dry.  Neurological:     Mental Status: He is alert and oriented to person, place, and time.     Deep Tendon Reflexes: Reflexes are normal and symmetric.  Psychiatric:        Behavior: Behavior normal.        Thought Content: Thought content normal.        Judgment: Judgment normal.    BP 126/70   Pulse 85   Ht 5\' 10"  (1.778 m)   Wt 249 lb (112.9 kg)   SpO2 98%   BMI 35.73 kg/m  Wt Readings from Last 3 Encounters:  07/09/20 249 lb (112.9 kg)  03/07/20 235 lb (106.6 kg)  01/23/20 246 lb (111.6 kg)     Health Maintenance Due  Topic Date Due   COVID-19 Vaccine (4 - Booster for Moderna series) 02/22/2020    There are no preventive care reminders to display for this patient.  No results found for: TSH Lab Results  Component Value Date   WBC 8.4 06/27/2019   HGB 16.1 06/27/2019   HCT 47.0 06/27/2019   MCV 86.4 06/27/2019   PLT 189 06/27/2019   Lab Results  Component Value Date   NA 140 06/27/2019   K 4.1 06/27/2019   CO2 25 06/27/2019   GLUCOSE 85 06/27/2019   BUN 14 06/27/2019   CREATININE 0.78 06/27/2019   BILITOT 0.7 06/27/2019   AST 13 06/27/2019   ALT 18 06/27/2019   PROT 6.8 06/27/2019   CALCIUM 9.5 06/27/2019   Lab Results  Component Value Date   CHOL 192 06/27/2019   Lab Results  Component Value Date   HDL 40 06/27/2019   Lab Results  Component Value Date   LDLCALC 119 (H) 06/27/2019   Lab Results  Component Value Date   TRIG 213 (H) 06/27/2019   Lab Results  Component Value Date   CHOLHDL 4.8 06/27/2019   No results found for: HGBA1C    Assessment & Plan:   Problem List Items Addressed This Visit       Other   Acute pain of left shoulder   Other Visit Diagnoses     Wellness examination    -  Primary   Relevant Orders   COMPLETE METABOLIC PANEL WITH GFR   Lipid Panel w/reflex Direct LDL   PSA  Hepatitis A hepatitis B combined vaccine IM (Completed)   Need for prophylactic vaccination  against hepatitis A and hepatitis B       Relevant Orders   Hepatitis A hepatitis B combined vaccine IM (Completed)   Rash       Relevant Orders   Fungal stain      Keep up a regular exercise program and make sure you are eating a healthy diet Try to eat 4 servings of dairy a day, or if you are lactose intolerant take a calcium with vitamin D daily.  Your vaccines are up to date.   Discussed need for shingles vaccine.  He has a missions trip coming up in September and says he really wants to wait until afterwards.  He is traveling to [place].  Left shoulder pain-I still think this is probably more musculoskeletal related but is not unreasonable to hold the statin for 2 weeks to see if he notices any improvement.  If its not better then okay to restart the statin and we can even make an adjustment if needed.  We can work on getting him in with Ortho or sports med.  If he does feel better off of it and we can try different statin.  Discussed getting his fourth COVID-vaccine.  Completed Twinrix series today.  He did the rapid immunization schedule so he is due for his 64-month.  Cyndie Chime on the left nipple looks most consistent with seborrheic ptosis.  He already has an appointment with dermatology later this summer.  No orders of the defined types were placed in this encounter.   Follow-up: Return in about 1 year (around 07/09/2021) for Wellness Exam.    Nani Gasser, MD

## 2020-07-10 LAB — COMPLETE METABOLIC PANEL WITH GFR
AG Ratio: 1.8 (calc) (ref 1.0–2.5)
ALT: 18 U/L (ref 9–46)
AST: 15 U/L (ref 10–35)
Albumin: 4.4 g/dL (ref 3.6–5.1)
Alkaline phosphatase (APISO): 44 U/L (ref 35–144)
BUN: 12 mg/dL (ref 7–25)
CO2: 25 mmol/L (ref 20–32)
Calcium: 9.4 mg/dL (ref 8.6–10.3)
Chloride: 104 mmol/L (ref 98–110)
Creat: 0.7 mg/dL (ref 0.70–1.25)
GFR, Est African American: 116 mL/min/{1.73_m2} (ref >=60)
GFR, Est Non African American: 100 mL/min/{1.73_m2} (ref >=60)
Globulin: 2.5 g/dL (calc) (ref 1.9–3.7)
Glucose, Bld: 81 mg/dL (ref 65–99)
Potassium: 3.9 mmol/L (ref 3.5–5.3)
Sodium: 139 mmol/L (ref 135–146)
Total Bilirubin: 0.6 mg/dL (ref 0.2–1.2)
Total Protein: 6.9 g/dL (ref 6.1–8.1)

## 2020-07-10 LAB — PSA: PSA: 0.44 ng/mL (ref <=4.00)

## 2020-07-10 LAB — LIPID PANEL W/REFLEX DIRECT LDL
Cholesterol: 177 mg/dL (ref <200)
HDL: 42 mg/dL (ref >=40)
LDL Cholesterol (Calc): 103 mg/dL (calc) — ABNORMAL HIGH
Non-HDL Cholesterol (Calc): 135 mg/dL (calc) — ABNORMAL HIGH (ref <130)
Total CHOL/HDL Ratio: 4.2 (calc) (ref <5.0)
Triglycerides: 200 mg/dL — ABNORMAL HIGH (ref <150)

## 2020-07-11 LAB — FUNGAL STAIN
FUNGAL SMEAR:: NONE SEEN
MICRO NUMBER:: 12056354
SPECIMEN QUALITY:: ADEQUATE

## 2020-07-12 ENCOUNTER — Other Ambulatory Visit (HOSPITAL_BASED_OUTPATIENT_CLINIC_OR_DEPARTMENT_OTHER): Payer: Self-pay

## 2020-07-12 MED ORDER — CLOTRIMAZOLE-BETAMETHASONE 1-0.05 % EX CREA
1.0000 "application " | TOPICAL_CREAM | Freq: Two times a day (BID) | CUTANEOUS | 0 refills | Status: DC
  Filled 2020-07-12: qty 30, 15d supply, fill #0

## 2020-07-12 NOTE — Addendum Note (Signed)
Addended by: Nani Gasser D on: 07/12/2020 10:16 AM   Modules accepted: Orders

## 2020-08-02 ENCOUNTER — Encounter: Payer: Self-pay | Admitting: Family Medicine

## 2020-08-03 ENCOUNTER — Ambulatory Visit (INDEPENDENT_AMBULATORY_CARE_PROVIDER_SITE_OTHER): Payer: 59 | Admitting: Physician Assistant

## 2020-08-03 ENCOUNTER — Encounter: Payer: Self-pay | Admitting: Physician Assistant

## 2020-08-03 VITALS — BP 114/81 | HR 93 | Ht 70.0 in | Wt 247.0 lb

## 2020-08-03 DIAGNOSIS — R1032 Left lower quadrant pain: Secondary | ICD-10-CM

## 2020-08-03 NOTE — Progress Notes (Signed)
Acute Office Visit  Subjective:    Patient ID: Luis Mullins, male    DOB: [birth year], 65 y.o.   MRN: [id number]  Chief Complaint  Patient presents with   Abdominal Pain    Abdominal Pain  Patient is in today for LLQ abd pain x 10 days It is described as a tender spot It does not radiate He reports he feels a bulge the size of his thumb when he is standing that resolves when he lays down Denies groin or testicular pain Denies fever, chills, malaise, nausea/vomiting, change in bowel habits Denies prior hx of hernias No known injury or trauma - but he was doing some digging in his yard about 2 weeks ago  No past medical history on file.  Past Surgical History:  Procedure Laterality Date   APPENDECTOMY  23   SCAR REVISION     TONSILLECTOMY     age 65    Family History  Problem Relation Age of Onset   Hypercholesterolemia Mother    Hypercholesterolemia Father    Heart attack Maternal Grandfather    Prostate cancer Paternal Uncle     Social History   Socioeconomic History   Marital status: Married    Spouse name: [name]   Number of children: Not on file   Years of education: Not on file   Highest education level: Not on file  Occupational History   Occupation: retired  Tobacco Use   Smoking status: Former    Types: Cigarettes    Quit date: 01/14/2019    Years since quitting: 1.5   Smokeless tobacco: Never  Vaping Use   Vaping Use: Never used  Substance and Sexual Activity   Alcohol use: Never   Drug use: Never   Sexual activity: Yes    Partners: Female    Birth control/protection: None  Other Topics Concern   Not on file  Social History Narrative   Retired.   Social Determinants of Health   Financial Resource Strain: Not on file  Food Insecurity: Not on file  Transportation Needs: Not on file  Physical Activity: Not on file  Stress: Not on file  Social Connections: Not on file  Intimate Partner Violence: Not on file    Outpatient  Medications Prior to Visit  Medication Sig Dispense Refill   Cholecalciferol 25 MCG (1000 UT) capsule Take 2,000 Units by mouth daily.     clotrimazole-betamethasone (LOTRISONE) cream Apply 1 application topically 2 (two) times daily. 30 g 0   nicotine polacrilex (NICORETTE) 2 MG gum Take by mouth as needed.     sildenafil (REVATIO) 20 MG tablet Take 2-3 tablets 30-60 minutes prior to sexual activity.     simvastatin (ZOCOR) 5 MG tablet TAKE 1 TABLET (5 MG TOTAL) BY MOUTH AT BEDTIME. (Patient not taking: Reported on 08/03/2020) 90 tablet 2   No facility-administered medications prior to visit.    Allergies  Allergen Reactions   Bee Venom     Review of Systems  Gastrointestinal:  Positive for abdominal pain.  All other systems reviewed and are negative.     Objective:    Physical Exam Vitals reviewed.  Constitutional:      Appearance: He is not ill-appearing.  Abdominal:     General: Abdomen is protuberant. A surgical scar is present. Bowel sounds are normal.     Palpations: Abdomen is soft.     Tenderness: There is abdominal tenderness in the left lower quadrant. There is no guarding.    Neurological:  Mental Status: He is alert.    BP 114/81   Pulse 93   Ht 5\' 10"  (1.778 m)   Wt 247 lb (112 kg)   SpO2 95%   BMI 35.44 kg/m  Wt Readings from Last 3 Encounters:  08/03/20 247 lb (112 kg)  07/09/20 249 lb (112.9 kg)  03/07/20 235 lb (106.6 kg)    Health Maintenance Due  Topic Date Due   COVID-19 Vaccine (4 - Booster for Moderna series) 02/22/2020    There are no preventive care reminders to display for this patient.     Assessment & Plan:   Problem List Items Addressed This Visit   None Visit Diagnoses     Abdominal wall pain in left lower quadrant    -  Primary   Relevant Orders   Ambulatory referral to General Surgery      Differential diagnosis is inguinal hernia, ventral hernia or abdominal wall strain - I did not feel a hernia on exam Referral  to Gen Surg for evaluation and second opinion Counseled on ER precautions Follow-up prn  No orders of the defined types were placed in this encounter.    04/21/2020, Carlis Stable

## 2020-08-03 NOTE — Telephone Encounter (Signed)
Patient scheduled.

## 2020-08-03 NOTE — Patient Instructions (Signed)
Hernia, Adult   A hernia is the bulging of an organ or tissue through a weak spot in the muscles of the abdomen (abdominal wall). Hernias develop most often near the belly button (navel) or the area where the leg meets the lower abdomen (groin). Common types of hernias include: Incisional hernia. This type bulges through a scar from an abdominal surgery. Umbilical hernia. This type develops near the navel. Inguinal hernia. This type develops in the groin or scrotum. Femoral hernia. This type develops under the groin, in the upper thigh area. Hiatal hernia. This type occurs when part of the stomach slides above the muscle that separates the abdomen from the chest (diaphragm). What are the causes? This condition may be caused by: Heavy lifting. Coughing over a long period of time. Straining to have a bowel movement. Constipation can lead to straining. An incision made during an abdominal surgery. A physical problem that is present at birth (congenital defect). Being overweight or obese. Smoking. Excess fluid in the abdomen. Undescended testicles in males. What are the signs or symptoms? The main symptom is a skin-colored, rounded bulge in the area of the hernia. However, a bulge may not always be present. It may grow bigger or be more visible when you cough or strain (such as when lifting something heavy). A hernia that can be pushed back into the area (is reducible) rarely causes pain. A hernia that cannot be pushed back into the area (is incarcerated) may lose its blood supply (become strangulated). A hernia that is incarcerated may cause: Pain. Fever. Nausea and vomiting. Swelling. Constipation. How is this diagnosed? A hernia may be diagnosed based on: Your symptoms and medical history. A physical exam. Your health care provider may ask you to cough or move in certain ways to see if the hernia becomes visible. Imaging tests, such as: X-rays. Ultrasound. CT scan. How is this  treated? A hernia that is small and painless may not need to be treated. A hernia that is large or painful may be treated with surgery. Inguinal hernias may be treated with surgery to prevent incarceration or strangulation. Strangulated hernias are always treated with surgery because a lack of blood supply to the trapped organ or tissue can cause it to die. Surgery to treat a hernia involves pushing the bulge back into place and repairing the weak area of the muscle or abdominal wall. Follow these instructions at home: Activity Avoid straining. Do not lift anything that is heavier than 10 lb (4.5 kg), or the limit that you are told, until your health care provider says that it is safe. When lifting heavy objects, lift with your leg muscles, not your back muscles. Preventing constipation Take actions to prevent constipation. Constipation leads to straining with bowel movements, which can make a hernia worse or cause a hernia repair to break down. Your health care provider may recommend that you: Drink enough fluid to keep your urine pale yellow. Eat foods that are high in fiber, such as fresh fruits and vegetables, whole grains, and beans. Limit foods that are high in fat and processed sugars, such as fried or sweet foods. Take an over-the-counter or prescription medicine for constipation. General instructions When coughing, try to cough gently. You may try to push the hernia back in place by very gently pressing on it while lying down. Do not try to force the bulge back in if it will not push in easily. If you are overweight, work with your health care provider to lose   weight safely. Do not use any products that contain nicotine or tobacco, such as cigarettes and e-cigarettes. If you need help quitting, ask your health care provider. If you are scheduled for hernia repair, watch your hernia for any changes in shape, size, or color. Tell your health care provider about any changes or new  symptoms. Take over-the-counter and prescription medicines only as told by your health care provider. Keep all follow-up visits as told by your health care provider. This is important. Contact a health care provider if: You develop new pain, swelling, or redness around your hernia. You have signs of constipation, such as: Fewer bowel movements in a week than normal. Difficulty having a bowel movement. Stools that are dry, hard, or larger than normal. Get help right away if: You have a fever. You have abdomen pain that gets worse. You feel nauseous or you vomit. You cannot push the hernia back in place by very gently pressing on it while lying down. Do not try to force the bulge back in if it will not push in easily. The hernia: Changes in shape, size, or color. Feels hard or tender. These symptoms may represent a serious problem that is an emergency. Do not wait to see if the symptoms will go away. Get medical help right away. Call your local emergency services (911 in the [place] Summary A hernia is the bulging of an organ or tissue through a weak spot in the muscles of the abdomen (abdominal wall). The main symptom is a skin-colored, rounded lump (bulge) in the hernia area. However, a bulge may not always be present. It may grow bigger or more visible when you cough or strain (such as when having a bowel movement). A hernia that is small and painless may not need to be treated. A hernia that is large or painful may be treated with surgery. Surgery to treat a hernia involves pushing the bulge back into place and repairing the weak part of the abdomen. This information is not intended to replace advice given to you by your health care provider. Make sure you discuss any questions you have with your health care provider. Document Revised: 04/22/2018 Document Reviewed: 10/01/2016 Elsevier Patient Education  2021 Elsevier Inc.  

## 2020-08-05 ENCOUNTER — Encounter: Payer: Self-pay | Admitting: Family Medicine

## 2020-08-07 ENCOUNTER — Ambulatory Visit: Payer: 59 | Admitting: Family Medicine

## 2020-08-12 ENCOUNTER — Encounter: Payer: Self-pay | Admitting: Physician Assistant

## 2020-08-21 DIAGNOSIS — L821 Other seborrheic keratosis: Secondary | ICD-10-CM | POA: Diagnosis not present

## 2020-08-31 DIAGNOSIS — E669 Obesity, unspecified: Secondary | ICD-10-CM | POA: Diagnosis not present

## 2020-08-31 DIAGNOSIS — R1032 Left lower quadrant pain: Secondary | ICD-10-CM | POA: Diagnosis not present

## 2020-08-31 DIAGNOSIS — M6208 Separation of muscle (nontraumatic), other site: Secondary | ICD-10-CM | POA: Diagnosis not present

## 2020-09-07 ENCOUNTER — Other Ambulatory Visit: Payer: Self-pay | Admitting: General Surgery

## 2020-09-07 DIAGNOSIS — R1032 Left lower quadrant pain: Secondary | ICD-10-CM

## 2020-09-26 ENCOUNTER — Ambulatory Visit
Admission: RE | Admit: 2020-09-26 | Discharge: 2020-09-26 | Disposition: A | Discharge status: Home / Self Care | Payer: 59 | Source: Ambulatory Visit | Attending: General Surgery | Admitting: General Surgery

## 2020-09-26 DIAGNOSIS — R1032 Left lower quadrant pain: Secondary | ICD-10-CM

## 2020-09-26 DIAGNOSIS — I700 Atherosclerosis of aorta: Secondary | ICD-10-CM | POA: Diagnosis not present

## 2020-09-26 DIAGNOSIS — Q631 Lobulated, fused and horseshoe kidney: Secondary | ICD-10-CM | POA: Diagnosis not present

## 2020-09-26 DIAGNOSIS — K5730 Diverticulosis of large intestine without perforation or abscess without bleeding: Secondary | ICD-10-CM | POA: Diagnosis not present

## 2020-09-26 MED ORDER — IOPAMIDOL (ISOVUE-300) INJECTION 61%
100.0000 mL | Freq: Once | INTRAVENOUS | Status: AC | PRN
  Administered 2020-09-26: 100 mL via INTRAVENOUS

## 2020-09-28 ENCOUNTER — Other Ambulatory Visit (HOSPITAL_BASED_OUTPATIENT_CLINIC_OR_DEPARTMENT_OTHER): Payer: Self-pay

## 2020-09-28 ENCOUNTER — Telehealth: Payer: Self-pay | Admitting: Family Medicine

## 2020-09-28 MED ORDER — ATOVAQUONE-PROGUANIL HCL 250-100 MG PO TABS
ORAL_TABLET | ORAL | 0 refills | Status: DC
  Filled 2020-09-28: qty 20, 20d supply, fill #0

## 2020-09-28 NOTE — Telephone Encounter (Signed)
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  Will need malaria medications.  Will be out of the country from September 24 October 1 approximately 1 week.  Meds ordered this encounter  Medications   atovaquone-proguanil (MALARONE) 250-100 MG TABS tablet    Sig: Take 1 tablet by mouth daily. Start medication 1 to 2 days before exposure/travel. Take 1 tab daily. Continue for 7 days after you return to the [place]    Dispense:  20 tablet    Refill:  0

## 2020-10-01 ENCOUNTER — Other Ambulatory Visit (HOSPITAL_BASED_OUTPATIENT_CLINIC_OR_DEPARTMENT_OTHER): Payer: Self-pay

## 2020-11-13 ENCOUNTER — Encounter: Payer: Self-pay | Admitting: Family Medicine

## 2020-11-29 ENCOUNTER — Other Ambulatory Visit: Payer: Self-pay

## 2020-11-29 ENCOUNTER — Encounter: Payer: Self-pay | Admitting: Family Medicine

## 2020-11-29 ENCOUNTER — Other Ambulatory Visit (HOSPITAL_BASED_OUTPATIENT_CLINIC_OR_DEPARTMENT_OTHER): Payer: Self-pay

## 2020-11-29 ENCOUNTER — Ambulatory Visit: Payer: 59 | Admitting: Family Medicine

## 2020-11-29 VITALS — BP 115/69 | HR 80 | Ht 70.0 in | Wt 251.0 lb

## 2020-11-29 DIAGNOSIS — E782 Mixed hyperlipidemia: Secondary | ICD-10-CM | POA: Diagnosis not present

## 2020-11-29 DIAGNOSIS — M25531 Pain in right wrist: Secondary | ICD-10-CM

## 2020-11-29 DIAGNOSIS — Z23 Encounter for immunization: Secondary | ICD-10-CM

## 2020-11-29 MED ORDER — ROSUVASTATIN CALCIUM 5 MG PO TABS
5.0000 mg | ORAL_TABLET | Freq: Every day | ORAL | 0 refills | Status: DC
Start: 2020-11-29 — End: 2021-03-07
  Filled 2020-11-29: qty 90, 90d supply, fill #0

## 2020-11-29 NOTE — Progress Notes (Addendum)
Acute Office Visit  Subjective:    Patient ID: Luis Mullins, male    DOB: [birth year], 65 y.o.   MRN: [id number]  Chief Complaint  Patient presents with   Hyperlipidemia    Pt reports that he stopped taking the Simvastatin 2 months ago due to it causing him to have joint pains     HPI Patient is in today to discuss cholesterol.  He actually quit taking his simvastatin about 2 months ago because he was getting a lot of soreness in his upper arms and shoulders and even a little bit in his legs.  It was definitely bilateral he says it took about a month after coming off of the medication but he noticed a tremendous and difference in how he felt.  So he is here today to discuss other options.  Having right wrist pain for the last several weeks it feels like it is just the whole wrist it feels sore and tender he has not noticed any swelling or redness.  No recent trauma.  But he has been digging a trench at his daughter's house to find the water lines.    History reviewed. No pertinent past medical history.  Past Surgical History:  Procedure Laterality Date   APPENDECTOMY  40   SCAR REVISION     TONSILLECTOMY     age 53    Family History  Problem Relation Age of Onset   Hypercholesterolemia Mother    Hypercholesterolemia Father    Heart attack Maternal Grandfather    Prostate cancer Paternal Uncle     Social History   Socioeconomic History   Marital status: Married    Spouse name: [name]   Number of children: Not on file   Years of education: Not on file   Highest education level: Not on file  Occupational History   Occupation: retired  Tobacco Use   Smoking status: Former    Types: Cigarettes    Quit date: 01/14/2019    Years since quitting: 1.8   Smokeless tobacco: Never  Vaping Use   Vaping Use: Never used  Substance and Sexual Activity   Alcohol use: Never   Drug use: Never   Sexual activity: Yes    Partners: Female    Birth control/protection: None   Other Topics Concern   Not on file  Social History Narrative   Retired.   Social Determinants of Health   Financial Resource Strain: Not on file  Food Insecurity: Not on file  Transportation Needs: Not on file  Physical Activity: Not on file  Stress: Not on file  Social Connections: Not on file  Intimate Partner Violence: Not on file    Outpatient Medications Prior to Visit  Medication Sig Dispense Refill   Cholecalciferol 25 MCG (1000 UT) capsule Take 2,000 Units by mouth daily.     clotrimazole-betamethasone (LOTRISONE) cream Apply 1 application topically 2 (two) times daily. 30 g 0   nicotine polacrilex (NICORETTE) 2 MG gum Take by mouth as needed.     sildenafil (REVATIO) 20 MG tablet Take 2-3 tablets 30-60 minutes prior to sexual activity.     atovaquone-proguanil (MALARONE) 250-100 MG TABS tablet Take 1 tablet by mouth daily. Start medication 1 to 2 days before exposure/travel. Take 1 tab daily. Continue for 7 days after you return to the [place] 20 tablet 0   simvastatin (ZOCOR) 5 MG tablet TAKE 1 TABLET (5 MG TOTAL) BY MOUTH AT BEDTIME. (Patient not taking: Reported on 08/03/2020) 90 tablet 2  No facility-administered medications prior to visit.    Allergies  Allergen Reactions   Bee Venom    Simvastatin Other (See Comments)    Myalgia     Review of Systems     Objective:    Physical Exam Constitutional:      Appearance: He is well-developed.  HENT:     Head: Normocephalic and atraumatic.  Cardiovascular:     Rate and Rhythm: Normal rate and regular rhythm.     Heart sounds: Normal heart sounds.  Pulmonary:     Effort: Pulmonary effort is normal.     Breath sounds: Normal breath sounds.  Skin:    General: Skin is warm and dry.  Neurological:     Mental Status: He is alert and oriented to person, place, and time.  Psychiatric:        Behavior: Behavior normal.    BP 115/69   Pulse 80   Ht 5\' 10"  (1.778 m)   Wt 251 lb (113.9 kg)   SpO2 95%   BMI  36.01 kg/m  Wt Readings from Last 3 Encounters:  11/29/20 251 lb (113.9 kg)  08/03/20 247 lb (112 kg)  07/09/20 249 lb (112.9 kg)    Health Maintenance Due  Topic Date Due   Zoster Vaccines- Shingrix (1 of 2) Never done   COVID-19 Vaccine (5 - Booster for Moderna series) 09/28/2020    There are no preventive care reminders to display for this patient.   No results found for: TSH Lab Results  Component Value Date   WBC 8.4 06/27/2019   HGB 16.1 06/27/2019   HCT 47.0 06/27/2019   MCV 86.4 06/27/2019   PLT 189 06/27/2019   Lab Results  Component Value Date   NA 139 07/09/2020   K 3.9 07/09/2020   CO2 25 07/09/2020   GLUCOSE 81 07/09/2020   BUN 12 07/09/2020   CREATININE 0.70 07/09/2020   BILITOT 0.6 07/09/2020   AST 15 07/09/2020   ALT 18 07/09/2020   PROT 6.9 07/09/2020   CALCIUM 9.4 07/09/2020   Lab Results  Component Value Date   CHOL 177 07/09/2020   Lab Results  Component Value Date   HDL 42 07/09/2020   Lab Results  Component Value Date   LDLCALC 103 (H) 07/09/2020   Lab Results  Component Value Date   TRIG 200 (H) 07/09/2020   Lab Results  Component Value Date   CHOLHDL 4.2 07/09/2020   No results found for: HGBA1C     Assessment & Plan:   Problem List Items Addressed This Visit       Other   Hyperlipidemia    We discussed switching to Crestor we will start with a very low dose and then work her way up from there.  New prescription sent to pharmacy.  Recheck lipids and liver enzymes in 3 months.      Relevant Medications   rosuvastatin (CRESTOR) 5 MG tablet   Other Visit Diagnoses     Need for immunization against influenza    -  Primary   Relevant Orders   Flu Vaccine QUAD 3mo+IM (Fluarix, Fluzone & Alfiuria Quad PF) (Completed)   Right wrist pain            Right wrist pain-normal exam.  No limitation of range of motion.  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  We discussed using an Ace wrap for comfort and compression, icing as needed and also using Tylenol or ibuprofen as needed as well.  Meds ordered this encounter  Medications   rosuvastatin (CRESTOR) 5 MG tablet    Sig: Take 1 tablet (5 mg total) by mouth at bedtime.    Dispense:  90 tablet    Refill:  0      Nani Gasser, MD

## 2020-11-29 NOTE — Patient Instructions (Signed)
Recheck cholesterol in 3 months.

## 2020-11-29 NOTE — Assessment & Plan Note (Signed)
We discussed switching to Crestor we will start with a very low dose and then work her way up from there.  New prescription sent to pharmacy.  Recheck lipids and liver enzymes in 3 months.

## 2021-02-08 ENCOUNTER — Other Ambulatory Visit: Payer: Self-pay

## 2021-02-08 ENCOUNTER — Emergency Department
Admission: EM | Admit: 2021-02-08 | Discharge: 2021-02-08 | Disposition: A | Discharge status: Home / Self Care | Payer: 59 | Source: Home / Self Care

## 2021-02-08 ENCOUNTER — Encounter: Payer: Self-pay | Admitting: Emergency Medicine

## 2021-02-08 ENCOUNTER — Encounter (HOSPITAL_COMMUNITY): Payer: Self-pay

## 2021-02-08 ENCOUNTER — Emergency Department (HOSPITAL_COMMUNITY)
Admission: EM | Admit: 2021-02-08 | Discharge: 2021-02-09 | Disposition: A | Discharge status: Home / Self Care | Payer: 59 | Attending: Emergency Medicine | Admitting: Emergency Medicine

## 2021-02-08 ENCOUNTER — Emergency Department (HOSPITAL_COMMUNITY): Payer: 59

## 2021-02-08 DIAGNOSIS — R109 Unspecified abdominal pain: Secondary | ICD-10-CM | POA: Diagnosis not present

## 2021-02-08 DIAGNOSIS — R1031 Right lower quadrant pain: Secondary | ICD-10-CM | POA: Diagnosis not present

## 2021-02-08 DIAGNOSIS — I700 Atherosclerosis of aorta: Secondary | ICD-10-CM | POA: Diagnosis not present

## 2021-02-08 DIAGNOSIS — K5732 Diverticulitis of large intestine without perforation or abscess without bleeding: Secondary | ICD-10-CM | POA: Diagnosis not present

## 2021-02-08 HISTORY — DX: Dizziness and giddiness: R42

## 2021-02-08 LAB — URINALYSIS, ROUTINE W REFLEX MICROSCOPIC
Bilirubin Urine: NEGATIVE
Glucose, UA: NEGATIVE mg/dL
Hgb urine dipstick: NEGATIVE
Ketones, ur: 5 mg/dL — AB
Leukocytes,Ua: NEGATIVE
Nitrite: NEGATIVE
Protein, ur: NEGATIVE mg/dL
Specific Gravity, Urine: 1.02 (ref 1.005–1.030)
pH: 6 (ref 5.0–8.0)

## 2021-02-08 LAB — COMPREHENSIVE METABOLIC PANEL
ALT: 22 U/L (ref 0–44)
AST: 18 U/L (ref 15–41)
Albumin: 4.4 g/dL (ref 3.5–5.0)
Alkaline Phosphatase: 44 U/L (ref 38–126)
Anion gap: 8 (ref 5–15)
BUN: 13 mg/dL (ref 8–23)
CO2: 25 mmol/L (ref 22–32)
Calcium: 9.1 mg/dL (ref 8.9–10.3)
Chloride: 103 mmol/L (ref 98–111)
Creatinine, Ser: 0.76 mg/dL (ref 0.61–1.24)
GFR, Estimated: >60 mL/min (ref >60)
Glucose, Bld: 103 mg/dL — ABNORMAL HIGH (ref 70–99)
Potassium: 3.9 mmol/L (ref 3.5–5.1)
Sodium: 136 mmol/L (ref 135–145)
Total Bilirubin: 0.6 mg/dL (ref 0.3–1.2)
Total Protein: 7.7 g/dL (ref 6.5–8.1)

## 2021-02-08 LAB — CBC WITH DIFFERENTIAL/PLATELET
Abs Immature Granulocytes: 0.03 10*3/uL (ref 0.00–0.07)
Basophils Absolute: 0.1 10*3/uL (ref 0.0–0.1)
Basophils Relative: 1 %
Eosinophils Absolute: 0.1 10*3/uL (ref 0.0–0.5)
Eosinophils Relative: 1 %
HCT: 49 % (ref 39.0–52.0)
Hemoglobin: 16.3 g/dL (ref 13.0–17.0)
Immature Granulocytes: 0 %
Lymphocytes Relative: 20 %
Lymphs Abs: 1.7 10*3/uL (ref 0.7–4.0)
MCH: 28.9 pg (ref 26.0–34.0)
MCHC: 33.3 g/dL (ref 30.0–36.0)
MCV: 86.9 fL (ref 80.0–100.0)
Monocytes Absolute: 0.6 10*3/uL (ref 0.1–1.0)
Monocytes Relative: 7 %
Neutro Abs: 6.1 10*3/uL (ref 1.7–7.7)
Neutrophils Relative %: 71 %
Platelets: 239 10*3/uL (ref 150–400)
RBC: 5.64 MIL/uL (ref 4.22–5.81)
RDW: 13 % (ref 11.5–15.5)
WBC: 8.5 10*3/uL (ref 4.0–10.5)
nRBC: 0 % (ref 0.0–0.2)

## 2021-02-08 LAB — LIPASE, BLOOD: Lipase: 26 U/L (ref 11–51)

## 2021-02-08 MED ORDER — CIPROFLOXACIN IN D5W 400 MG/200ML IV SOLN
400.0000 mg | Freq: Once | INTRAVENOUS | Status: AC
  Administered 2021-02-08: 400 mg via INTRAVENOUS
  Filled 2021-02-08: qty 200

## 2021-02-08 MED ORDER — METRONIDAZOLE 500 MG PO TABS
500.0000 mg | ORAL_TABLET | Freq: Once | ORAL | Status: AC
  Administered 2021-02-08: 500 mg via ORAL
  Filled 2021-02-08: qty 1

## 2021-02-08 MED ORDER — ONDANSETRON HCL 4 MG/2ML IJ SOLN
4.0000 mg | Freq: Once | INTRAMUSCULAR | Status: AC
  Administered 2021-02-08: 4 mg via INTRAVENOUS
  Filled 2021-02-08: qty 2

## 2021-02-08 MED ORDER — IOHEXOL 300 MG/ML  SOLN
100.0000 mL | Freq: Once | INTRAMUSCULAR | Status: AC | PRN
  Administered 2021-02-08: 100 mL via INTRAVENOUS

## 2021-02-08 MED ORDER — SODIUM CHLORIDE 0.9 % IV BOLUS
1000.0000 mL | Freq: Once | INTRAVENOUS | Status: AC
  Administered 2021-02-08: 1000 mL via INTRAVENOUS

## 2021-02-08 MED ORDER — MORPHINE SULFATE (PF) 4 MG/ML IV SOLN
4.0000 mg | Freq: Once | INTRAVENOUS | Status: AC
  Administered 2021-02-08: 4 mg via INTRAVENOUS
  Filled 2021-02-08: qty 1

## 2021-02-08 NOTE — ED Triage Notes (Signed)
Patient c/o RLQ abdominal pain x 2 days. Patient states he had vomiting yesterday only.

## 2021-02-08 NOTE — ED Provider Triage Note (Signed)
Emergency Medicine Provider Triage Evaluation Note  Luis Mullins , a 66 y.o. male  was evaluated in triage.  Pt complains of right lower quadrant abdominal pain.  This has been ongoing for 2 to 3 days.  He is a prior open appendectomy in 1975. He did have pain radiate into his right testicle last night.  He denies any history of kidney stones or back pain.    Physical Exam  BP (!) 139/99 (BP Location: Right Arm)    Pulse 77    Temp 98.5 F (36.9 C) (Oral)    Resp 20    SpO2 98%  Gen:   Awake, no distress   Resp:  Normal effort  MSK:   Moves extremities without difficulty  Other:  Abdomen is tender to palpation right lower quadrant.  No CVA tenderness to percussion.  Medical Decision Making  Medically screening exam initiated at 7:11 PM.  Appropriate orders placed.  Luis Mullins was informed that the remainder of the evaluation will be completed by another provider, this initial triage assessment does not replace that evaluation, and the importance of remaining in the ED until their evaluation is complete.     Cristina Gong, [place] 02/08/21 1912
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
## 2021-02-08 NOTE — ED Provider Notes (Signed)
[facility] DEPT Provider Note   CSN: [id number] Arrival date & time: 02/08/21  1850     History  Chief Complaint  Patient presents with   Abdominal Pain   Emesis    Luis Mullins is a 66 y.o. male.  66 year old male with history of hyperlipidemia presents to the emergency department for evaluation of abdominal pain.  His abdominal pain began yesterday and has been fairly constant.  It is most notable in his right lower abdomen and right groin.  At times will radiate to his right testicle.  He took Tylenol for symptoms this morning with mild improvement.  Symptoms progressed to include nausea and emesis x1 this afternoon.  Does note decreased appetite.  Denies associated melena, hematochezia, inability to void, dysuria, hematuria, diarrhea, fevers.  No known sick contacts.  Abdominal surgical history notable for appendectomy.  The history is provided by the patient. No language interpreter was used.  Abdominal Pain Associated symptoms: vomiting   Emesis Associated symptoms: abdominal pain       Home Medications Prior to Admission medications   Medication Sig Start Date End Date Taking? Authorizing Provider  ciprofloxacin (CIPRO) 500 MG tablet Take 1 tablet (500 mg total) by mouth 2 (two) times daily. 02/09/21  Yes Antonietta Breach, PA-C  HYDROcodone-acetaminophen (NORCO/VICODIN) 5-325 MG tablet Take 1 tablet by mouth every 6 (six) hours as needed for severe pain. 02/09/21  Yes Antonietta Breach, PA-C  metroNIDAZOLE (FLAGYL) 500 MG tablet Take 1 tablet (500 mg total) by mouth 3 (three) times daily for 10 days. 02/09/21 02/19/21 Yes Antonietta Breach, PA-C  ondansetron (ZOFRAN-ODT) 4 MG disintegrating tablet Take 1 tablet (4 mg total) by mouth every 8 (eight) hours as needed for nausea or vomiting. 02/09/21  Yes Antonietta Breach, PA-C  Cholecalciferol 25 MCG (1000 UT) capsule Take 2,000 Units by mouth daily.    [provider]  clotrimazole-betamethasone (LOTRISONE)  cream Apply 1 application topically 2 (two) times daily. 07/12/20   Hali Marry, MD  nicotine polacrilex (NICORETTE) 2 MG gum Take by mouth as needed.    [provider]  rosuvastatin (CRESTOR) 5 MG tablet Take 1 tablet (5 mg total) by mouth at bedtime. 11/29/20   Hali Marry, MD  sildenafil (REVATIO) 20 MG tablet Take 2-3 tablets 30-60 minutes prior to sexual activity. 03/20/19   [provider]      Allergies    Bee venom and Simvastatin    Review of Systems   Review of Systems  Gastrointestinal:  Positive for abdominal pain and vomiting.  Ten systems reviewed and are negative for acute change, except as noted in the HPI.    Physical Exam Updated Vital Signs BP 136/90    Pulse 69    Temp 98.5 F (36.9 C) (Oral)    Resp 15    SpO2 96%   Physical Exam Vitals and nursing note reviewed. Exam conducted with a chaperone present.  Constitutional:      General: He is not in acute distress.    Appearance: He is well-developed. He is not diaphoretic.     Comments: Nontoxic appearing and in NAD  HENT:     Head: Normocephalic and atraumatic.  Eyes:     General: No scleral icterus.    Conjunctiva/sclera: Conjunctivae normal.  Cardiovascular:     Rate and Rhythm: Normal rate and regular rhythm.     Pulses: Normal pulses.  Pulmonary:     Effort: Pulmonary effort is normal. No respiratory distress.  Breath sounds: No stridor. No wheezing.     Comments: Respirations even and unlabored Abdominal:     Comments: Suprapubic and right lower abdominal tenderness to palpation.  There is mild voluntary guarding without palpable masses, peritoneal signs.  Genitourinary:    Comments: Exam chaperoned by RN.  No scrotal swelling, testicular tenderness or masses.  Normal penis. Musculoskeletal:        General: Normal range of motion.     Cervical back: Normal range of motion.  Skin:    General: Skin is warm and dry.     Coloration: Skin is not pale.      Findings: No erythema or rash.  Neurological:     Mental Status: He is alert and oriented to person, place, and time.  Psychiatric:        Behavior: Behavior normal.    ED Results / Procedures / Treatments   Labs (all labs ordered are listed, but only abnormal results are displayed) Labs Reviewed  COMPREHENSIVE METABOLIC PANEL - Abnormal; Notable for the following components:      Result Value   Glucose, Bld 103 (*)    All other components within normal limits  URINALYSIS, ROUTINE W REFLEX MICROSCOPIC - Abnormal; Notable for the following components:   Ketones, ur 5 (*)    All other components within normal limits  LIPASE, BLOOD  CBC WITH DIFFERENTIAL/PLATELET    EKG None  Radiology CT ABDOMEN PELVIS W CONTRAST  Result Date: 02/08/2021 CLINICAL DATA:  RLQ abdominal pain (Age >= 14y) EXAM: CT ABDOMEN AND PELVIS WITH CONTRAST TECHNIQUE: Multidetector CT imaging of the abdomen and pelvis was performed using the standard protocol following bolus administration of intravenous contrast. RADIATION DOSE REDUCTION: This exam was performed according to the departmental dose-optimization program which includes automated exposure control, adjustment of the mA and/or kV according to patient size and/or use of iterative reconstruction technique. CONTRAST:  115mL OMNIPAQUE IOHEXOL 300 MG/ML  SOLN COMPARISON:  CT abdomen pelvis 09/26/2020 FINDINGS: Lower chest: No acute abnormality. Hepatobiliary: No focal liver abnormality. No gallstones, gallbladder wall thickening, or pericholecystic fluid. No biliary dilatation. Pancreas: No focal lesion. Normal pancreatic contour. No surrounding inflammatory changes. No main pancreatic ductal dilatation. Spleen: Indeterminate subcentimeter hypodensity along the anterior spleen (2:14). Otherwise normal in size without focal abnormality. Adrenals/Urinary Tract: No adrenal nodule bilaterally. Horseshoe kidney. Subcentimeter hypodensities are too small to characterize.  Several pericentimeter fluid dense lesions likely represent simple renal cysts. Bilateral kidneys enhance symmetrically. No hydronephrosis. No hydroureter. The urinary bladder is unremarkable. Stomach/Bowel: Stomach is within normal limits. No evidence of bowel wall thickening or dilatation. Scattered colonic diverticulosis. Trace pericolonic fat stranding surrounding a sigmoid diverticula (2:65, 5:67). Status post appendectomy Vascular/Lymphatic: No abdominal aorta or iliac aneurysm. Mild atherosclerotic plaque of the aorta and its branches. No abdominal, pelvic, or inguinal lymphadenopathy. Reproductive: Prostate is unremarkable. Other: No intraperitoneal free fluid. No intraperitoneal free gas. No organized fluid collection. Musculoskeletal: No abdominal wall hernia or abnormality. No suspicious lytic or blastic osseous lesions. No acute displaced fracture. Multilevel degenerative changes of the spine. IMPRESSION: 1. Colonic diverticulosis with possible mild/early uncomplicated acute sigmoid diverticulitis. 2. Incidentally noted horseshoe kidney. 3.  Aortic Atherosclerosis (ICD10-I70.0). Electronically Signed   By: Iven Finn M.D.   On: 02/08/2021 22:46    Procedures Procedures    Medications Ordered in ED Medications  iohexol (OMNIPAQUE) 300 MG/ML solution 100 mL (100 mLs Intravenous Contrast Given 02/08/21 2227)  sodium chloride 0.9 % bolus 1,000 mL (0 mLs Intravenous Stopped  02/08/21 2351)  ondansetron (ZOFRAN) injection 4 mg (4 mg Intravenous Given 02/08/21 2252)  morphine 4 MG/ML injection 4 mg (4 mg Intravenous Given 02/08/21 2254)  ciprofloxacin (CIPRO) IVPB 400 mg (0 mg Intravenous Stopped 02/09/21 0051)  metroNIDAZOLE (FLAGYL) tablet 500 mg (500 mg Oral Given 02/08/21 2346)  oxyCODONE-acetaminophen (PERCOCET/ROXICET) 5-325 MG per tablet 1 tablet (1 tablet Oral Given 02/09/21 0058)    ED Course/ Medical Decision Making/ A&P Clinical Course as of 02/09/21 0215  Sat Feb 09, 2021  0100 Pain  improved with medications. IV abx completed. [KH]    Clinical Course User Index [KH] Antonietta Breach, PA-C                           Medical Decision Making Risk Prescription drug management.   This patient presents to the ED for concern of lower abdominal pain, this involves an extensive number of treatment options, and is a complaint that carries with it a high risk of complications and morbidity.  The differential diagnosis includes ileus vs constipation vs diverticulitis vs perforation vs kidney stone vs intraabdominal abscess vs viral illness vs strangulated/incarcerated hernia.   Co morbidities that complicate the patient evaluation  Obesity    Additional history obtained:  Additional history obtained from wife at bedside External records from outside source obtained and reviewed including prior urgent care visit   Lab Tests:  I Ordered, and personally interpreted labs.  The pertinent results include: Normal CBC, CMP, lipase, UA. No leukocytosis.   Imaging Studies ordered:  I ordered imaging studies including CT abdomen/pelvis w contrast  I independently visualized and interpreted imaging which showed findings of early acute sigmoid diverticulitis I agree with the radiologist interpretation   Medicines ordered and prescription drug management:  I ordered medication including Morphine for pain, Ciprofloxacin and Flagyl for diverticulitis treatment  Reevaluation of the patient after these medicines showed that the patient improved I have reviewed the patients home medicines and have made adjustments as needed   Reevaluation:  After the interventions noted above, I reevaluated the patient and found that they have :improved   Social Determinants of Health:  Insured Good social support; wife at bedside   Dispostion:  Admission considered; however, after consideration of the diagnostic results and the patients response to treatment, I feel that the patent would  benefit from discharge on oral antibiotics and close outpatient PCP follow up. No criteria for SIRS/Sepsis. Labs normal today. CT without evidence of diverticulitis complications; no perforation or abscess. Patient tolerating PO mediations without vomiting. Reports no pain after IV and oral pain medications. Encouraged return for worsening symptoms. Return precautions discussed and provided. Patient discharged in stable condition with no unaddressed concerns.         Final Clinical Impression(s) / ED Diagnoses Final diagnoses:  Sigmoid diverticulitis    Rx / DC Orders ED Discharge Orders          Ordered    ciprofloxacin (CIPRO) 500 MG tablet  2 times daily        02/09/21 0113    metroNIDAZOLE (FLAGYL) 500 MG tablet  3 times daily        02/09/21 0113    HYDROcodone-acetaminophen (NORCO/VICODIN) 5-325 MG tablet  Every 6 hours PRN        02/09/21 0113    ondansetron (ZOFRAN-ODT) 4 MG disintegrating tablet  Every 8 hours PRN        02/09/21 0114  Antonietta Breach, PA-C 02/09/21 0221    Ezequiel Essex, MD 02/09/21 [phone]

## 2021-02-08 NOTE — ED Notes (Signed)
Patient is being discharged from the Urgent Care and sent to the Emergency Department via POV w/ his wife . Per M.Ragan, FNP  patient is in need of higher level of care due to need for CT scan & pain control. Patient is aware and verbalizes understanding of plan of care.  Vitals:   02/08/21 1801  BP: 140/80  Pulse: 81  Resp: 18  Temp: 99.3 F (37.4 C)  SpO2: 96%   Report called to [employer] charge RN Florentina Addison, RN)

## 2021-02-08 NOTE — ED Triage Notes (Signed)
RLQ pain x 2 days Constant pain  Pain increases w/ palpation  Tylenol 1000mg  2 hours pta - no relief Vomiting x 1 (yesterday afternoon) Decreased appetite  Normal BM yesterday  Here w/ his wife

## 2021-02-08 NOTE — Discharge Instructions (Addendum)
Advised patient and wife to go to [place], ED now for CT of abdomen and pelvis with contrast and possibly serological test for right lower quadrant abdominal pain.  Staff RN called ahead to make Luis Mullins, ED aware of patient's impending arrival.  Patient agreed and verbalized understanding of these instructions and this plan of care this evening.

## 2021-02-09 DIAGNOSIS — K5732 Diverticulitis of large intestine without perforation or abscess without bleeding: Secondary | ICD-10-CM | POA: Diagnosis not present

## 2021-02-09 MED ORDER — ONDANSETRON 4 MG PO TBDP: 4.0000 mg | ORAL_TABLET | Freq: Three times a day (TID) | ORAL | 0 refills | Status: DC | PRN

## 2021-02-09 MED ORDER — OXYCODONE-ACETAMINOPHEN 5-325 MG PO TABS
1.0000 | ORAL_TABLET | Freq: Once | ORAL | Status: AC
  Administered 2021-02-09: 1 via ORAL
  Filled 2021-02-09: qty 1

## 2021-02-09 MED ORDER — HYDROCODONE-ACETAMINOPHEN 5-325 MG PO TABS: 1.0000 | ORAL_TABLET | Freq: Four times a day (QID) | ORAL | 0 refills | Status: DC | PRN

## 2021-02-09 MED ORDER — CIPROFLOXACIN HCL 500 MG PO TABS: 500.0000 mg | ORAL_TABLET | Freq: Two times a day (BID) | ORAL | 0 refills | Status: DC

## 2021-02-09 MED ORDER — METRONIDAZOLE 500 MG PO TABS: 500.0000 mg | ORAL_TABLET | Freq: Three times a day (TID) | ORAL | 0 refills | Status: AC

## 2021-02-09 NOTE — Discharge Instructions (Addendum)
Take ciprofloxacin and Flagyl as prescribed until finished.  You have been prescribed a short course of Norco to use for pain control.  Do not drive or drink alcohol after taking this medication as it may make you drowsy and impair your judgment.  We recommend close follow-up with your primary care doctor in the next 48 to 72 hours.  Return to the ED for new or concerning symptoms.

## 2021-02-14 ENCOUNTER — Ambulatory Visit: Payer: 59 | Admitting: Family Medicine

## 2021-02-14 ENCOUNTER — Other Ambulatory Visit: Payer: Self-pay

## 2021-02-14 ENCOUNTER — Encounter: Payer: Self-pay | Admitting: Family Medicine

## 2021-02-14 VITALS — BP 122/83 | HR 97 | Resp 16 | Ht 70.0 in | Wt 252.0 lb

## 2021-02-14 DIAGNOSIS — K5792 Diverticulitis of intestine, part unspecified, without perforation or abscess without bleeding: Secondary | ICD-10-CM | POA: Diagnosis not present

## 2021-02-14 DIAGNOSIS — Q631 Lobulated, fused and horseshoe kidney: Secondary | ICD-10-CM | POA: Insufficient documentation

## 2021-02-14 DIAGNOSIS — I700 Atherosclerosis of aorta: Secondary | ICD-10-CM

## 2021-02-14 HISTORY — DX: Atherosclerosis of aorta: I70.0

## 2021-02-14 HISTORY — DX: Lobulated, fused and horseshoe kidney: Q63.1

## 2021-02-14 NOTE — Assessment & Plan Note (Addendum)
Continue Crestor and work on optimization of cholesterol levels.  We will plan to recheck LDL levels today.  Reviewed findings.  Lab Results  Component Value Date   CHOL 177 07/09/2020   HDL 42 07/09/2020   LDLCALC 103 (H) 07/09/2020   TRIG 200 (H) 07/09/2020   CHOLHDL 4.2 07/09/2020

## 2021-02-14 NOTE — Progress Notes (Signed)
Established Patient Office Visit  Subjective:  Patient ID: Luis Mullins, male    DOB: 1955-02-19  Age: 66 y.o. MRN: [id number]  CC:  Chief Complaint  Patient presents with   [facility] for Diverticulitis. Patient states he is feeling better however he is still having discomfort in his lower abdomen.     HPI Luis Mullins presents for hospital follow-up.  He was seen in the emergency department January 27 for sigmoid diverticulitis.  Pain was primarily focused in the right lower abdomen.  He was given Cipro, Flagyl and Zofran.  They also noted some aortic atherosclerosis as well as a horseshoe kidney.  Overall he is feeling better but still has some soreness and tenderness in that right lower quadrant.  He still has a few more days left on the antibiotic he says he just feels like they are making him feel tired no fevers or chills.  He feels like his bowels are moving normally and not had any issues there.  Hospital records reviewed.  Past Medical History:  Diagnosis Date   Vertigo     Past Surgical History:  Procedure Laterality Date   APPENDECTOMY  47   SCAR REVISION     TONSILLECTOMY     age 17    Family History  Problem Relation Age of Onset   Hypercholesterolemia Mother    Hypercholesterolemia Father    Heart attack Maternal Grandfather    Prostate cancer Paternal Uncle     Social History   Socioeconomic History   Marital status: Married    Spouse name: Juliann Pulse   Number of children: Not on file   Years of education: Not on file   Highest education level: Not on file  Occupational History   Occupation: retired  Tobacco Use   Smoking status: Former    Types: Cigarettes    Quit date: 01/14/2019    Years since quitting: 2.0   Smokeless tobacco: Never  Vaping Use   Vaping Use: Never used  Substance and Sexual Activity   Alcohol use: Never   Drug use: Never   Sexual activity: Yes    Partners: Female    Birth control/protection: None  Other  Topics Concern   Not on file  Social History Narrative   Retired.   Social Determinants of Health   Financial Resource Strain: Not on file  Food Insecurity: Not on file  Transportation Needs: Not on file  Physical Activity: Not on file  Stress: Not on file  Social Connections: Not on file  Intimate Partner Violence: Not on file    Outpatient Medications Prior to Visit  Medication Sig Dispense Refill   Cholecalciferol 25 MCG (1000 UT) capsule Take 2,000 Units by mouth daily.     ciprofloxacin (CIPRO) 500 MG tablet Take 1 tablet (500 mg total) by mouth 2 (two) times daily. 20 tablet 0   clotrimazole-betamethasone (LOTRISONE) cream Apply 1 application topically 2 (two) times daily. 30 g 0   HYDROcodone-acetaminophen (NORCO/VICODIN) 5-325 MG tablet Take 1 tablet by mouth every 6 (six) hours as needed for severe pain. 10 tablet 0   metroNIDAZOLE (FLAGYL) 500 MG tablet Take 1 tablet (500 mg total) by mouth 3 (three) times daily for 10 days. 30 tablet 0   nicotine polacrilex (NICORETTE) 2 MG gum Take by mouth as needed.     ondansetron (ZOFRAN-ODT) 4 MG disintegrating tablet Take 1 tablet (4 mg total) by mouth every 8 (eight) hours as needed for nausea or vomiting.  10 tablet 0   rosuvastatin (CRESTOR) 5 MG tablet Take 1 tablet (5 mg total) by mouth at bedtime. 90 tablet 0   sildenafil (REVATIO) 20 MG tablet Take 2-3 tablets 30-60 minutes prior to sexual activity.     No facility-administered medications prior to visit.    Allergies  Allergen Reactions   Bee Venom    Simvastatin Other (See Comments)    Myalgia     ROS Review of Systems    Objective:    Physical Exam Constitutional:      Appearance: Normal appearance. He is well-developed.  HENT:     Head: Normocephalic and atraumatic.  Cardiovascular:     Rate and Rhythm: Normal rate and regular rhythm.     Heart sounds: Normal heart sounds.  Pulmonary:     Effort: Pulmonary effort is normal.     Breath sounds: Normal  breath sounds.  Abdominal:     Palpations: Abdomen is soft.     Comments: Mild tenderness in the right lower quadrant.  Old surgical scar from appendectomy well-healed.  Skin:    General: Skin is warm and dry.  Neurological:     Mental Status: He is alert and oriented to person, place, and time. Mental status is at baseline.  Psychiatric:        Behavior: Behavior normal.    BP 122/83 (BP Location: Left Arm)    Pulse 97    Resp 16    Ht 5\' 10"  (1.778 m)    Wt 252 lb (114.3 kg)    SpO2 97%    BMI 36.16 kg/m  Wt Readings from Last 3 Encounters:  02/14/21 252 lb (114.3 kg)  02/08/21 240 lb (108.9 kg)  11/29/20 251 lb (113.9 kg)     There are no preventive care reminders to display for this patient.   There are no preventive care reminders to display for this patient.  No results found for: TSH Lab Results  Component Value Date   WBC 8.5 02/08/2021   HGB 16.3 02/08/2021   HCT 49.0 02/08/2021   MCV 86.9 02/08/2021   PLT 239 02/08/2021   Lab Results  Component Value Date   NA 136 02/08/2021   K 3.9 02/08/2021   CO2 25 02/08/2021   GLUCOSE 103 (H) 02/08/2021   BUN 13 02/08/2021   CREATININE 0.76 02/08/2021   BILITOT 0.6 02/08/2021   ALKPHOS 44 02/08/2021   AST 18 02/08/2021   ALT 22 02/08/2021   PROT 7.7 02/08/2021   ALBUMIN 4.4 02/08/2021   CALCIUM 9.1 02/08/2021   ANIONGAP 8 02/08/2021   Lab Results  Component Value Date   CHOL 177 07/09/2020   Lab Results  Component Value Date   HDL 42 07/09/2020   Lab Results  Component Value Date   LDLCALC 103 (H) 07/09/2020   Lab Results  Component Value Date   TRIG 200 (H) 07/09/2020   Lab Results  Component Value Date   CHOLHDL 4.2 07/09/2020   No results found for: HGBA1C    Assessment & Plan:   Problem List Items Addressed This Visit       Cardiovascular and Mediastinum   Aortic atherosclerosis ([place]) - Primary    Continue Crestor and work on optimization of cholesterol levels.  We will plan to  recheck LDL levels today.  Reviewed findings.  Lab Results  Component Value Date   CHOL 177 07/09/2020   HDL 42 07/09/2020   LDLCALC 103 (H) 07/09/2020   TRIG 200 (  H) 07/09/2020   CHOLHDL 4.2 07/09/2020         Relevant Orders   COMPLETE METABOLIC PANEL WITH GFR   Lipid Panel w/reflex Direct LDL     Genitourinary   Horseshoe kidney    Discussed diagnosis.  At this point in his life if he has not had any complication such as kidney stones or ureteral reflux etc. that I think it is unlikely that he will have any problems or concerns.      Other Visit Diagnoses     Diverticulitis       Relevant Orders   CBC with Differential/Platelet       Diverticulitis-just encouraged him to eat a soft bland diet and a lot of liquids right now just for a little bit of bowel rest I do think that would help.  He does not have to avoid seeds going forward and we discussed that today.  Make sure to complete the antibiotics and if not better after the weekend then please let me know.  No orders of the defined types were placed in this encounter.   Follow-up: No follow-ups on file.    Beatrice Lecher, MD

## 2021-02-14 NOTE — Assessment & Plan Note (Signed)
Discussed diagnosis.  At this point in his life if he has not had any complication such as kidney stones or ureteral reflux etc. that I think it is unlikely that he will have any problems or concerns.

## 2021-02-15 LAB — CBC WITH DIFFERENTIAL/PLATELET
Absolute Monocytes: 583 cells/uL (ref 200–950)
Basophils Absolute: 31 cells/uL (ref 0–200)
Basophils Relative: 0.5 %
Eosinophils Absolute: 68 cells/uL (ref 15–500)
Eosinophils Relative: 1.1 %
HCT: 46.6 % (ref 38.5–50.0)
Hemoglobin: 16 g/dL (ref 13.2–17.1)
Lymphs Abs: 1252 cells/uL (ref 850–3900)
MCH: 29.2 pg (ref 27.0–33.0)
MCHC: 34.3 g/dL (ref 32.0–36.0)
MCV: 85 fL (ref 80.0–100.0)
MPV: 10.1 fL (ref 7.5–12.5)
Monocytes Relative: 9.4 %
Neutro Abs: 4266 cells/uL (ref 1500–7800)
Neutrophils Relative %: 68.8 %
Platelets: 221 10*3/uL (ref 140–400)
RBC: 5.48 10*6/uL (ref 4.20–5.80)
RDW: 13.4 % (ref 11.0–15.0)
Total Lymphocyte: 20.2 %
WBC: 6.2 10*3/uL (ref 3.8–10.8)

## 2021-02-15 LAB — COMPLETE METABOLIC PANEL WITH GFR
AG Ratio: 1.7 (calc) (ref 1.0–2.5)
ALT: 75 U/L — ABNORMAL HIGH (ref 9–46)
AST: 58 U/L — ABNORMAL HIGH (ref 10–35)
Albumin: 4.4 g/dL (ref 3.6–5.1)
Alkaline phosphatase (APISO): 42 U/L (ref 35–144)
BUN: 16 mg/dL (ref 7–25)
CO2: 25 mmol/L (ref 20–32)
Calcium: 9.3 mg/dL (ref 8.6–10.3)
Chloride: 105 mmol/L (ref 98–110)
Creat: 0.88 mg/dL (ref 0.70–1.35)
Globulin: 2.6 g/dL (calc) (ref 1.9–3.7)
Glucose, Bld: 91 mg/dL (ref 65–99)
Potassium: 4 mmol/L (ref 3.5–5.3)
Sodium: 140 mmol/L (ref 135–146)
Total Bilirubin: 0.5 mg/dL (ref 0.2–1.2)
Total Protein: 7 g/dL (ref 6.1–8.1)
eGFR: 96 mL/min/{1.73_m2} (ref >=60)

## 2021-02-15 LAB — LIPID PANEL W/REFLEX DIRECT LDL
Cholesterol: 132 mg/dL (ref <200)
HDL: 46 mg/dL (ref >=40)
LDL Cholesterol (Calc): 65 mg/dL (calc)
Non-HDL Cholesterol (Calc): 86 mg/dL (calc) (ref <130)
Total CHOL/HDL Ratio: 2.9 (calc) (ref <5.0)
Triglycerides: 120 mg/dL (ref <150)

## 2021-02-18 NOTE — Progress Notes (Signed)
Hi Luis Mullins, liver enzymes are little elevated.  They were normal 10 days prior to that.  Directly related to the episode of diverticulitis that she had.  But I do want to keep an eye on it and make sure that it is trending back down.  Plan to recheck levels in about 2 weeks hopefully you are feeling a lot better.  Additional labs are normal and reassuring.

## 2021-03-07 ENCOUNTER — Other Ambulatory Visit (HOSPITAL_BASED_OUTPATIENT_CLINIC_OR_DEPARTMENT_OTHER): Payer: Self-pay

## 2021-03-07 ENCOUNTER — Other Ambulatory Visit: Payer: Self-pay | Admitting: Family Medicine

## 2021-03-07 DIAGNOSIS — E782 Mixed hyperlipidemia: Secondary | ICD-10-CM

## 2021-03-07 MED ORDER — ROSUVASTATIN CALCIUM 5 MG PO TABS
5.0000 mg | ORAL_TABLET | Freq: Every day | ORAL | 1 refills | Status: DC
  Filled 2021-03-07: qty 90, 90d supply, fill #0
  Filled 2021-06-26: qty 90, 90d supply, fill #1

## 2021-06-12 ENCOUNTER — Other Ambulatory Visit: Payer: Self-pay | Admitting: Family Medicine

## 2021-06-13 ENCOUNTER — Other Ambulatory Visit (HOSPITAL_BASED_OUTPATIENT_CLINIC_OR_DEPARTMENT_OTHER): Payer: Self-pay

## 2021-06-13 MED ORDER — SILDENAFIL CITRATE 20 MG PO TABS
40.0000 mg | ORAL_TABLET | Freq: Every day | ORAL | 11 refills | Status: AC | PRN
  Filled 2021-06-13: qty 30, 7d supply, fill #0
  Filled 2021-09-20: qty 30, 7d supply, fill #1

## 2021-06-13 NOTE — Telephone Encounter (Signed)
Last Refilled 03/20/2019  Last Office Visit 02/14/2021

## 2021-06-26 ENCOUNTER — Other Ambulatory Visit (HOSPITAL_BASED_OUTPATIENT_CLINIC_OR_DEPARTMENT_OTHER): Payer: Self-pay

## 2021-07-30 ENCOUNTER — Encounter: Payer: 59 | Admitting: Family Medicine

## 2021-08-08 ENCOUNTER — Ambulatory Visit (INDEPENDENT_AMBULATORY_CARE_PROVIDER_SITE_OTHER): Payer: 59 | Admitting: Family Medicine

## 2021-08-08 VITALS — BP 121/75 | HR 89 | Ht 70.0 in | Wt 244.0 lb

## 2021-08-08 DIAGNOSIS — E782 Mixed hyperlipidemia: Secondary | ICD-10-CM

## 2021-08-08 DIAGNOSIS — I700 Atherosclerosis of aorta: Secondary | ICD-10-CM | POA: Diagnosis not present

## 2021-08-08 DIAGNOSIS — Z0000 Encounter for general adult medical examination without abnormal findings: Secondary | ICD-10-CM | POA: Diagnosis not present

## 2021-08-08 NOTE — Assessment & Plan Note (Signed)
We will get labs today off of the statin but discussed with him he still has atherosclerosis of the aorta so he still needs to be on a statin.  Encouraged him to restart it every other day and see if symptoms return if they do then he can skip to every 3 days a week and we will see if that is effective if not we can always try different statin as well.

## 2021-08-08 NOTE — Progress Notes (Addendum)
Complete physical exam  Patient: Luis Mullins   DOB: [birth year]   66 y.o. Male  MRN: [id number]  Subjective:    Chief Complaint  Patient presents with   Annual Exam    Luis Mullins is a 66 y.o. male who presents today for a complete physical exam. He reports consuming a general diet.  Walks the dog and yardwork  He generally feels well. Marland Kitchen He does have additional problems to discuss today.   He did want to let me know that he stopped his statin about 3 weeks ago that is partly why he would like to recheck his cholesterol today.  He says that he was getting some cramping in his upper body and chest and soreness.  He says it completely went away after he stopped his statin.  He had previously tried simvastatin and had cramping as well.   Most recent fall risk assessment:    02/14/2021   11:12 AM  Fall Risk   Falls in the past year? 0  Injury with Fall? 0  Risk for fall due to : No Fall Risks  Follow up Falls prevention discussed;Falls evaluation completed     Most recent depression screenings:    02/14/2021   11:12 AM 07/09/2020    2:15 PM  PHQ 2/9 Scores  PHQ - 2 Score 2 0  PHQ- 9 Score 2         Patient Care Team: Agapito Games, MD as PCP - General (Family Medicine)   Outpatient Medications Prior to Visit  Medication Sig   clotrimazole-betamethasone (LOTRISONE) cream Apply 1 application topically 2 (two) times daily.   nicotine polacrilex (NICORETTE) 2 MG gum Take by mouth as needed.   rosuvastatin (CRESTOR) 5 MG tablet Take 1 tablet (5 mg total) by mouth at bedtime.   sildenafil (REVATIO) 20 MG tablet Take 2-4 tablets (40-80 mg total) by mouth daily as needed.   [DISCONTINUED] Cholecalciferol 25 MCG (1000 UT) capsule Take 2,000 Units by mouth daily.   [DISCONTINUED] ciprofloxacin (CIPRO) 500 MG tablet Take 1 tablet (500 mg total) by mouth 2 (two) times daily.   [DISCONTINUED] HYDROcodone-acetaminophen (NORCO/VICODIN) 5-325 MG tablet Take 1 tablet by  mouth every 6 (six) hours as needed for severe pain.   [DISCONTINUED] ondansetron (ZOFRAN-ODT) 4 MG disintegrating tablet Take 1 tablet (4 mg total) by mouth every 8 (eight) hours as needed for nausea or vomiting.   No facility-administered medications prior to visit.    ROS        Objective:     BP 121/75   Pulse 89   Ht 5\' 10"  (1.778 m)   Wt 244 lb (110.7 kg)   SpO2 95%   BMI 35.01 kg/m    Physical Exam Constitutional:      Appearance: He is well-developed.  HENT:     Head: Normocephalic and atraumatic.     Right Ear: External ear normal.     Left Ear: External ear normal.     Nose: Nose normal.  Eyes:     Conjunctiva/sclera: Conjunctivae normal.     Pupils: Pupils are equal, round, and reactive to light.  Neck:     Thyroid: No thyromegaly.  Cardiovascular:     Rate and Rhythm: Normal rate and regular rhythm.     Heart sounds: Normal heart sounds.  Pulmonary:     Effort: Pulmonary effort is normal.     Breath sounds: Normal breath sounds.  Abdominal:     General: Bowel sounds are  normal. There is no distension.     Palpations: Abdomen is soft. There is no mass.     Tenderness: There is no abdominal tenderness. There is no guarding or rebound.     Comments: Diastases rectus.  Musculoskeletal:        General: Normal range of motion.     Cervical back: Normal range of motion and neck supple.  Lymphadenopathy:     Cervical: No cervical adenopathy.  Skin:    General: Skin is warm and dry.  Neurological:     Mental Status: He is alert and oriented to person, place, and time.     Deep Tendon Reflexes: Reflexes are normal and symmetric.  Psychiatric:        Behavior: Behavior normal.        Thought Content: Thought content normal.        Judgment: Judgment normal.      No results found for any visits on 08/08/21.     Assessment & Plan:    Routine Health Maintenance and Physical Exam  Immunization History  Administered Date(s) Administered   Hep A /  Hep B 07/04/2019, 07/11/2019, 07/25/2019, 07/09/2020   Influenza Inj Mdck Quad With Preservative 10/28/2014   Influenza Split 10/28/2010   Influenza, Seasonal, Injecte, Preservative Fre 11/02/2015, 10/28/2016   Influenza,inj,Quad PF,6+ Mos 10/28/2014, 10/24/2019, 11/29/2020   Influenza-Unspecified 10/20/2017, 10/19/2018   Moderna Sars-Covid-2 Vaccination 03/17/2019, 04/14/2019, 11/22/2019, 08/03/2020   Td 10/03/2015   Tdap 03/05/2005   Typhoid Inactivated 07/27/2019    Health Maintenance  Topic Date Due   COVID-19 Vaccine (5 - Booster for Moderna series) 09/28/2020   Zoster Vaccines- Shingrix (1 of 2) 11/08/2021 (Originally 11/05/1974)   INFLUENZA VACCINE  08/13/2021   COLONOSCOPY (Pts 45-65yrs Insurance coverage will need to be confirmed)  03/15/2022   TETANUS/TDAP  10/02/2025   Hepatitis C Screening  Completed   HIV Screening  Completed   HPV VACCINES  Aged Out   Pneumococcal Vaccine 19-34 Years old  Discontinued    Discussed health benefits of physical activity, and encouraged him to engage in regular exercise appropriate for his age and condition.  Problem List Items Addressed This Visit       Cardiovascular and Mediastinum   Aortic atherosclerosis (HCC)    See note under hyperlipidemia.        Other   Hyperlipidemia    We will get labs today off of the statin but discussed with him he still has atherosclerosis of the aorta so he still needs to be on a statin.  Encouraged him to restart it every other day and see if symptoms return if they do then he can skip to every 3 days a week and we will see if that is effective if not we can always try different statin as well.      Other Visit Diagnoses     Routine general medical examination at a health care facility    -  Primary   Relevant Orders   COMPLETE METABOLIC PANEL WITH GFR   PSA   Lipid Panel w/reflex Direct LDL     Keep up a regular exercise program and make sure you are eating a healthy diet Try to eat 4  servings of dairy a day, or if you are lactose intolerant take a calcium with vitamin D daily.  Your vaccines are up to date.  Declined shingles vaccine.   No follow-ups on file.     Nani Gasser, MD

## 2021-08-08 NOTE — Assessment & Plan Note (Signed)
See note under hyperlipidemia.

## 2021-08-09 LAB — LIPID PANEL W/REFLEX DIRECT LDL
Cholesterol: 213 mg/dL — ABNORMAL HIGH (ref <200)
HDL: 36 mg/dL — ABNORMAL LOW (ref >=40)
LDL Cholesterol (Calc): 135 mg/dL (calc) — ABNORMAL HIGH
Non-HDL Cholesterol (Calc): 177 mg/dL (calc) — ABNORMAL HIGH (ref <130)
Total CHOL/HDL Ratio: 5.9 (calc) — ABNORMAL HIGH (ref <5.0)
Triglycerides: 297 mg/dL — ABNORMAL HIGH (ref <150)

## 2021-08-09 LAB — COMPLETE METABOLIC PANEL WITH GFR
AG Ratio: 1.6 (calc) (ref 1.0–2.5)
ALT: 17 U/L (ref 9–46)
AST: 15 U/L (ref 10–35)
Albumin: 4.4 g/dL (ref 3.6–5.1)
Alkaline phosphatase (APISO): 42 U/L (ref 35–144)
BUN: 12 mg/dL (ref 7–25)
CO2: 23 mmol/L (ref 20–32)
Calcium: 9.5 mg/dL (ref 8.6–10.3)
Chloride: 104 mmol/L (ref 98–110)
Creat: 0.73 mg/dL (ref 0.70–1.35)
Globulin: 2.7 g/dL (calc) (ref 1.9–3.7)
Glucose, Bld: 83 mg/dL (ref 65–99)
Potassium: 3.9 mmol/L (ref 3.5–5.3)
Sodium: 139 mmol/L (ref 135–146)
Total Bilirubin: 0.7 mg/dL (ref 0.2–1.2)
Total Protein: 7.1 g/dL (ref 6.1–8.1)
eGFR: 102 mL/min/{1.73_m2} (ref >=60)

## 2021-08-09 LAB — PSA: PSA: 0.56 ng/mL (ref <=4.00)

## 2021-08-09 NOTE — Progress Notes (Signed)
Hi Luis Mullins, total cholesterol and LDL jumped up significantly.  Since you are not on your medication its not surprising.  It is more than double what it was on the medication so lets definitely proceed with the plan of taking the cholesterol pill every other day and see how you do.  We always have a choice of trying a different statin down the road as well.  Usually find 1 that you feel good on.  Liver enzymes look great.  Prostate test is normal.

## 2021-09-20 ENCOUNTER — Other Ambulatory Visit: Payer: Self-pay | Admitting: Family Medicine

## 2021-09-20 ENCOUNTER — Other Ambulatory Visit (HOSPITAL_BASED_OUTPATIENT_CLINIC_OR_DEPARTMENT_OTHER): Payer: Self-pay

## 2021-09-20 DIAGNOSIS — E782 Mixed hyperlipidemia: Secondary | ICD-10-CM

## 2021-09-23 ENCOUNTER — Other Ambulatory Visit (HOSPITAL_BASED_OUTPATIENT_CLINIC_OR_DEPARTMENT_OTHER): Payer: Self-pay

## 2021-09-23 MED ORDER — ROSUVASTATIN CALCIUM 5 MG PO TABS
5.0000 mg | ORAL_TABLET | Freq: Every day | ORAL | 1 refills | Status: DC
  Filled 2021-09-23: qty 90, 90d supply, fill #0

## 2021-10-26 ENCOUNTER — Encounter: Payer: Self-pay | Admitting: Family Medicine

## 2021-10-26 DIAGNOSIS — Z23 Encounter for immunization: Secondary | ICD-10-CM | POA: Diagnosis not present

## 2021-11-20 DIAGNOSIS — Z23 Encounter for immunization: Secondary | ICD-10-CM | POA: Diagnosis not present

## 2021-11-22 ENCOUNTER — Encounter: Payer: Self-pay | Admitting: Family Medicine

## 2021-11-26 NOTE — Telephone Encounter (Signed)
Documented

## 2021-12-11 ENCOUNTER — Encounter: Payer: Self-pay | Admitting: Family Medicine

## 2021-12-11 DIAGNOSIS — Z23 Encounter for immunization: Secondary | ICD-10-CM | POA: Diagnosis not present

## 2022-01-02 ENCOUNTER — Encounter: Payer: Self-pay | Admitting: Family Medicine

## 2022-01-02 DIAGNOSIS — E782 Mixed hyperlipidemia: Secondary | ICD-10-CM

## 2022-01-02 MED ORDER — ROSUVASTATIN CALCIUM 5 MG PO TABS
5.0000 mg | ORAL_TABLET | Freq: Every day | ORAL | 1 refills | Status: DC
Start: 2022-01-02 — End: 2022-08-26

## 2022-03-26 ENCOUNTER — Ambulatory Visit (INDEPENDENT_AMBULATORY_CARE_PROVIDER_SITE_OTHER): Payer: Medicare Other | Admitting: Physician Assistant

## 2022-03-26 ENCOUNTER — Encounter: Payer: Self-pay | Admitting: Physician Assistant

## 2022-03-26 VITALS — BP 127/77 | HR 86 | Ht 70.0 in | Wt 252.0 lb

## 2022-03-26 DIAGNOSIS — B029 Zoster without complications: Secondary | ICD-10-CM

## 2022-03-26 MED ORDER — TRAMADOL HCL 50 MG PO TABS: 50.0000 mg | ORAL_TABLET | Freq: Four times a day (QID) | ORAL | 0 refills | Status: DC | PRN

## 2022-03-26 MED ORDER — VALACYCLOVIR HCL 1 G PO TABS: 1000.0000 mg | ORAL_TABLET | Freq: Three times a day (TID) | ORAL | 0 refills | Status: DC

## 2022-03-26 MED ORDER — LIDOCAINE 5 % EX OINT: 1.0000 | TOPICAL_OINTMENT | Freq: Three times a day (TID) | CUTANEOUS | 1 refills | Status: DC | PRN

## 2022-03-26 NOTE — Patient Instructions (Addendum)
Start valtrex three times a day for 7 days Lidocaine gel as needed up to three times a day Alternate tylenol '1000mg'$  and ibuprofen '800mg'$  every 4-6 hours Tramadol for break through pain Cool compresses  Shingles  Shingles, which is also known as herpes zoster, is an infection that causes a painful skin rash and fluid-filled blisters. It is caused by a virus. Shingles only develops in people who: Have had chickenpox. Have been vaccinated against chickenpox. Shingles is rare in this group. What are the causes? Shingles is caused by varicella-zoster virus. This is the same virus that causes chickenpox. After a person is exposed to the virus, it stays in the body in an inactive (dormant) state. Shingles develops if the virus is reactivated. This can happen many years after the first (initial) exposure to the virus. It is not known what causes this virus to be reactivated. What increases the risk? People who have had chickenpox or received the chickenpox vaccine are at risk for shingles. Shingles infection is more common in people who: Are older than 66 years of age. Have a weakened disease-fighting system (immune system), such as people with: HIV (human immunodeficiency virus). AIDS (acquired immunodeficiency syndrome). Cancer. Are taking medicines that weaken the immune system, such as organ transplant medicines. Are experiencing a lot of stress. What are the signs or symptoms? Early symptoms of this condition include itching, tingling, and pain in an area on your skin. Pain may be described as burning, stabbing, or throbbing. A few days or weeks after early symptoms start, a painful red rash appears. The rash is usually on one side of the body and has a band-like or belt-like pattern. The rash eventually turns into fluid-filled blisters that break open, change into scabs, and dry up in about 2-3 weeks. At any time during the infection, you may also develop: A fever. Chills. A  headache. Nausea. How is this diagnosed? This condition is diagnosed with a skin exam. Skin or fluid samples (a culture) may be taken from the blisters before a diagnosis is made. How is this treated? The rash may last for several weeks. There is not a specific cure for this condition. Your health care provider may prescribe medicines to help you manage pain, recover more quickly, and avoid long-term problems. Medicines may include: Antiviral medicines. Anti-inflammatory medicines. Pain medicines. Anti-itching medicines (antihistamines). If the area involved is on your face, you may be referred to a specialist, such as an eye doctor (ophthalmologist) or an ear, nose, and throat (ENT) doctor (otorhinolaryngologist) to help you avoid eye problems, chronic pain, or disability. Follow these instructions at home: Medicines Take over-the-counter and prescription medicines only as told by your health care provider. Apply an anti-itch cream or numbing cream to the affected area as told by your health care provider. Relieving itching and discomfort  Apply cold, wet cloths (cold compresses) to the area of the rash or blisters as told by your health care provider. Cool baths can be soothing. Try adding baking soda or dry oatmeal to the water to reduce itching. Do not bathe in hot water. Use calamine lotion as recommended by your health care provider. This is an over-the-counter lotion that helps to relieve itchiness. Blister and rash care Keep your rash covered with a loose bandage (dressing). Wear loose-fitting clothing to help ease the pain of material rubbing against the rash. Wash your hands with soap and water for at least 20 seconds before and after you change your dressing. If soap and water  are not available, use hand sanitizer. Change your dressing as told by your health care provider. Keep your rash and blisters clean by washing the area with mild soap and cool water as told by your health  care provider. Check your rash every day for signs of infection. Check for: More redness, swelling, or pain. Fluid or blood. Warmth. Pus or a bad smell. Do not scratch your rash or pick at your blisters. To help avoid scratching: Keep your fingernails clean and cut short. Wear gloves or mittens while you sleep, if scratching is a problem. General instructions Rest as told by your health care provider. Wash your hands often with soap and water for at least 20 seconds. If soap and water are not available, use hand sanitizer. Doing this lowers your chance of getting a bacterial skin infection. Before your blisters change into scabs, your shingles infection can cause chickenpox in people who have never had it or have never been vaccinated against it. To prevent this from happening, avoid contact with other people, especially: Babies. Pregnant women. Children who have eczema. Older people who have transplants. People who have chronic illnesses, such as cancer or AIDS. Keep all follow-up visits. This is important. How is this prevented? Getting vaccinated is the best way to prevent shingles and protect against shingles complications. If you have not been vaccinated, talk with your health care provider about getting the vaccine. Where to find more information Centers for Disease Control and Prevention: http://www.wolf.info/ Contact a health care provider if: Your pain is not relieved with prescribed medicines. Your pain does not get better after the rash heals. You have any of these signs of infection: More redness, swelling, or pain around the rash. Fluid or blood coming from the rash. Warmth coming from your rash. Pus or a bad smell coming from the rash. A fever. Get help right away if: The rash is on your face or nose. You have facial pain, pain around your eye area, or loss of feeling on one side of your face. You have difficulty seeing. You have ear pain or have ringing in your ear. You have  a loss of taste. Your condition gets worse. Summary Shingles, also known as herpes zoster, is an infection that causes a painful skin rash and fluid-filled blisters. This condition is diagnosed with a skin exam. Skin or fluid samples (a culture) may be taken from the blisters. Keep your rash covered with a loose bandage (dressing). Wear loose-fitting clothing to help ease the pain of material rubbing against the rash. Before your blisters change into scabs, your shingles infection can cause chickenpox in people who have never had it or have never been vaccinated against it. This information is not intended to replace advice given to you by your health care provider. Make sure you discuss any questions you have with your health care provider. Document Revised: 12/26/2019 Document Reviewed: 12/26/2019 Elsevier Patient Education  [place].
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
## 2022-04-03 ENCOUNTER — Telehealth: Payer: Self-pay | Admitting: Family Medicine

## 2022-04-03 DIAGNOSIS — B029 Zoster without complications: Secondary | ICD-10-CM

## 2022-04-03 MED ORDER — TRAMADOL HCL 50 MG PO TABS: 50.0000 mg | ORAL_TABLET | Freq: Four times a day (QID) | ORAL | 0 refills | Status: AC | PRN

## 2022-04-03 NOTE — Telephone Encounter (Signed)
Please initiate prior authorization on lidocaine gel. Refilled tramadol as well.   Meds ordered this encounter  Medications   traMADol (ULTRAM) 50 MG tablet    Sig: Take 1 tablet (50 mg total) by mouth every 6 (six) hours as needed for up to 5 days.    Dispense:  21 tablet    Refill:  0

## 2022-04-04 ENCOUNTER — Telehealth: Payer: Self-pay

## 2022-04-04 NOTE — Telephone Encounter (Signed)
Initiated Prior authorization [id number] 5% ointment Via: Covermymeds Case/Key:[id number] Status: approved as of 04/04/22 Reason:This approval authorizes your coverage from 01/13/2022 - 07/03/2022 Notified Pt via: Mychart

## 2022-04-08 ENCOUNTER — Encounter: Payer: Self-pay | Admitting: Family Medicine

## 2022-04-08 DIAGNOSIS — Z09 Encounter for follow-up examination after completed treatment for conditions other than malignant neoplasm: Secondary | ICD-10-CM | POA: Diagnosis not present

## 2022-04-08 DIAGNOSIS — D123 Benign neoplasm of transverse colon: Secondary | ICD-10-CM | POA: Diagnosis not present

## 2022-04-08 DIAGNOSIS — Z86010 Personal history of colonic polyps: Secondary | ICD-10-CM | POA: Diagnosis not present

## 2022-04-08 LAB — HM COLONOSCOPY

## 2022-04-09 ENCOUNTER — Encounter: Payer: Self-pay | Admitting: Family Medicine

## 2022-06-12 IMAGING — DX DG SHOULDER 2+V*L*
3 series · 3 of 3 positions shown · non-contrast
Comparison: None.

CLINICAL DATA: Left shoulder pain

EXAM:
LEFT SHOULDER - 2+ VIEW

[shoulder grashey]
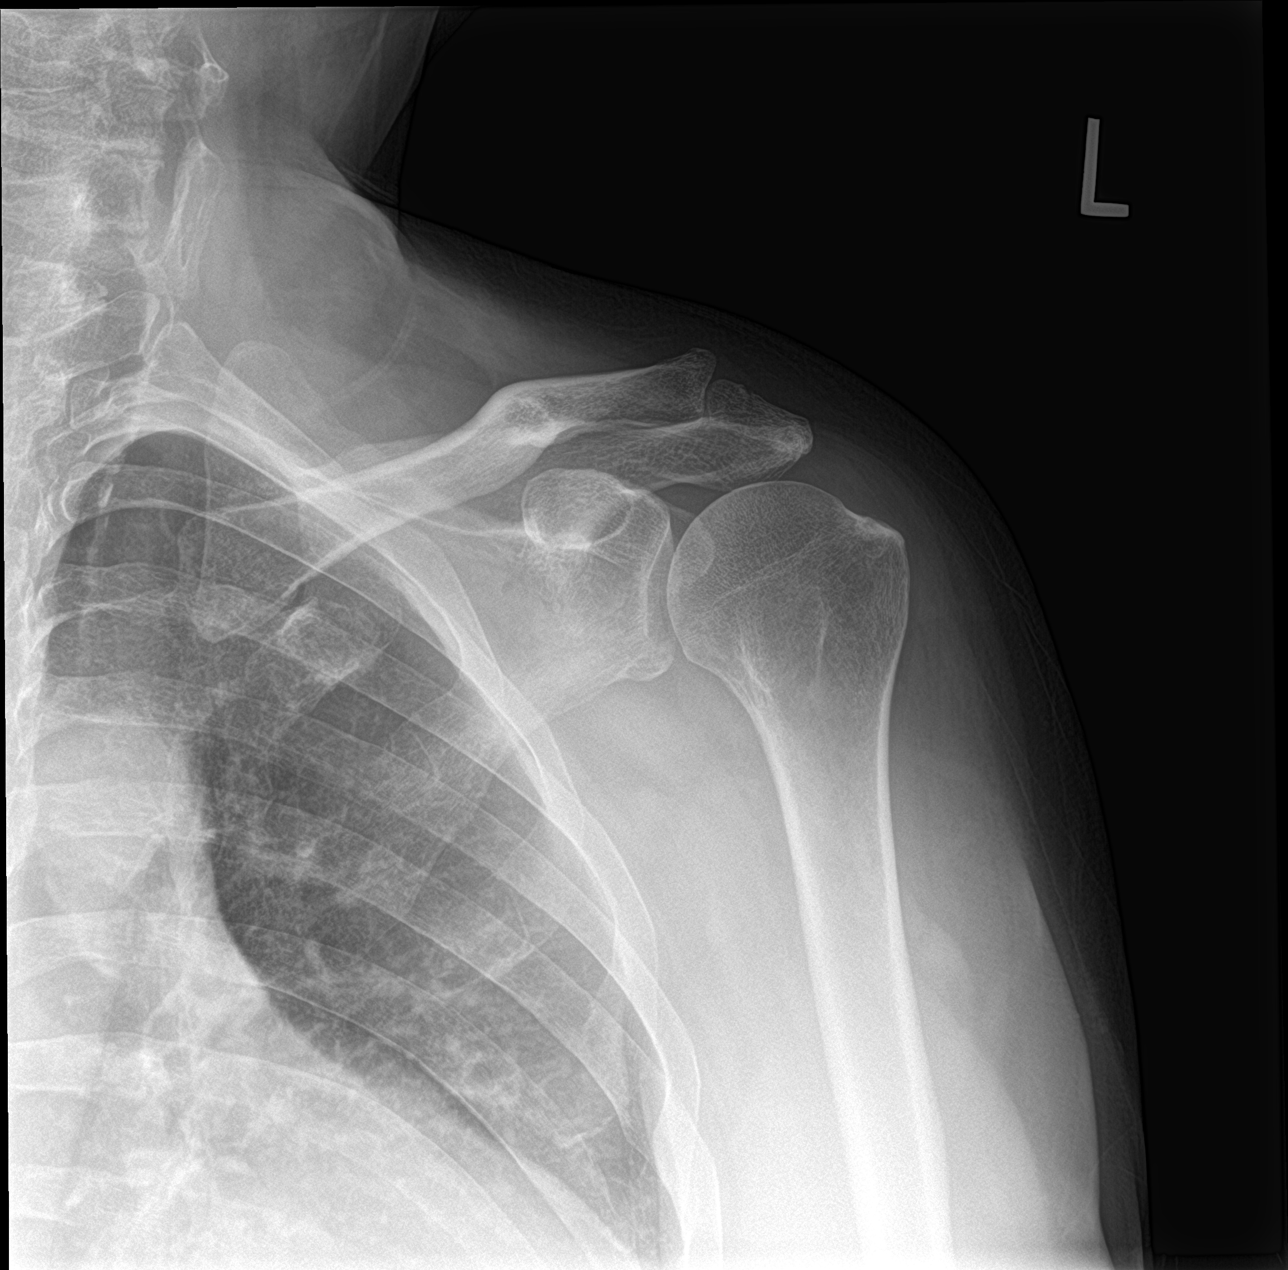

[shoulder y view]
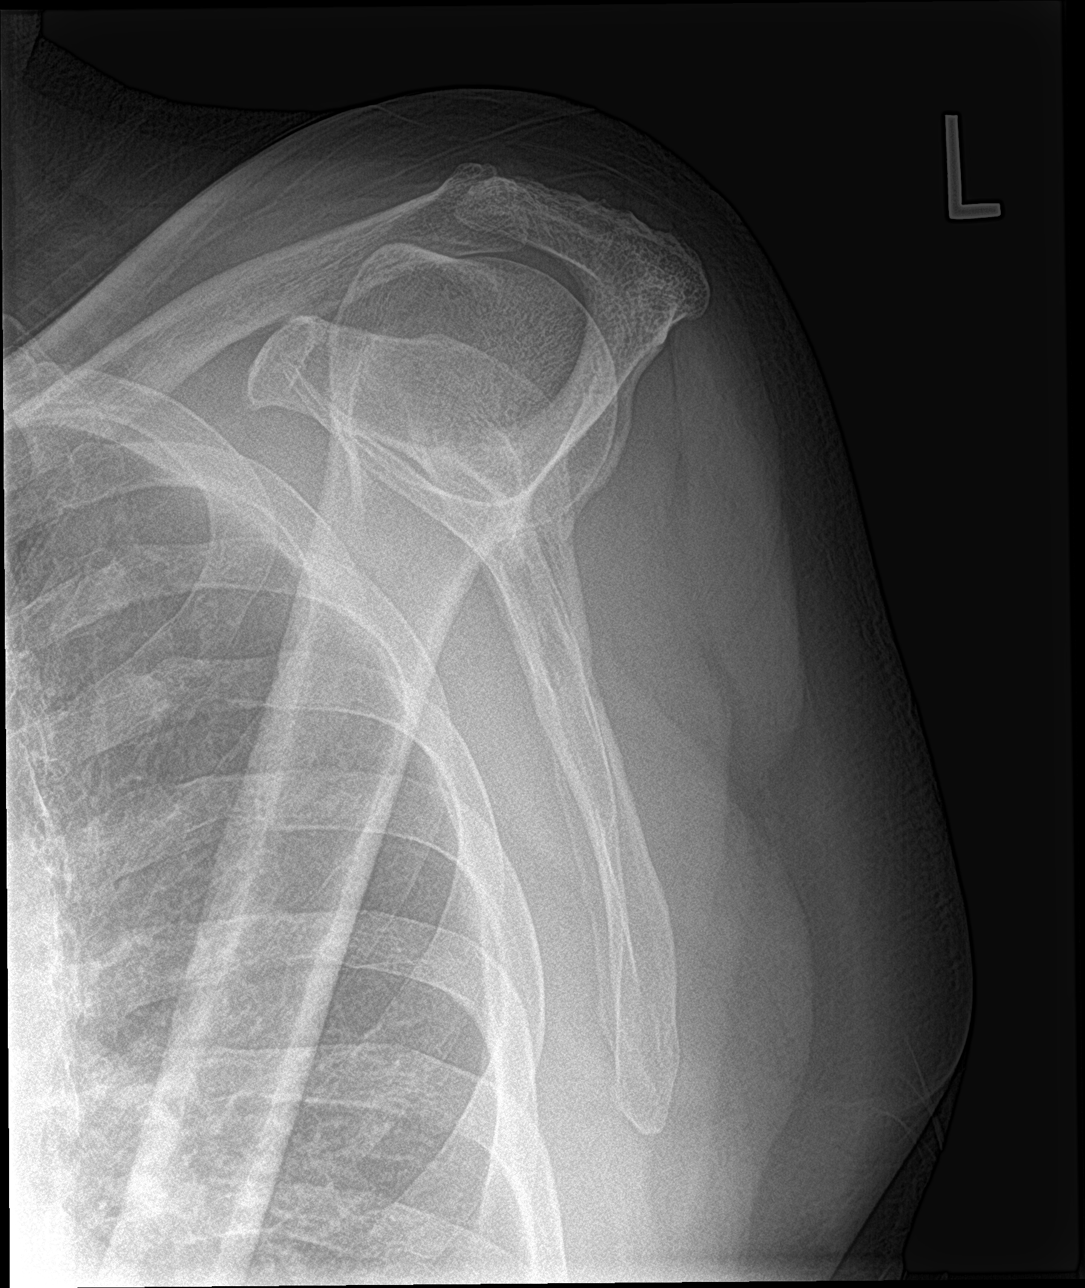

[shoulder axillary]
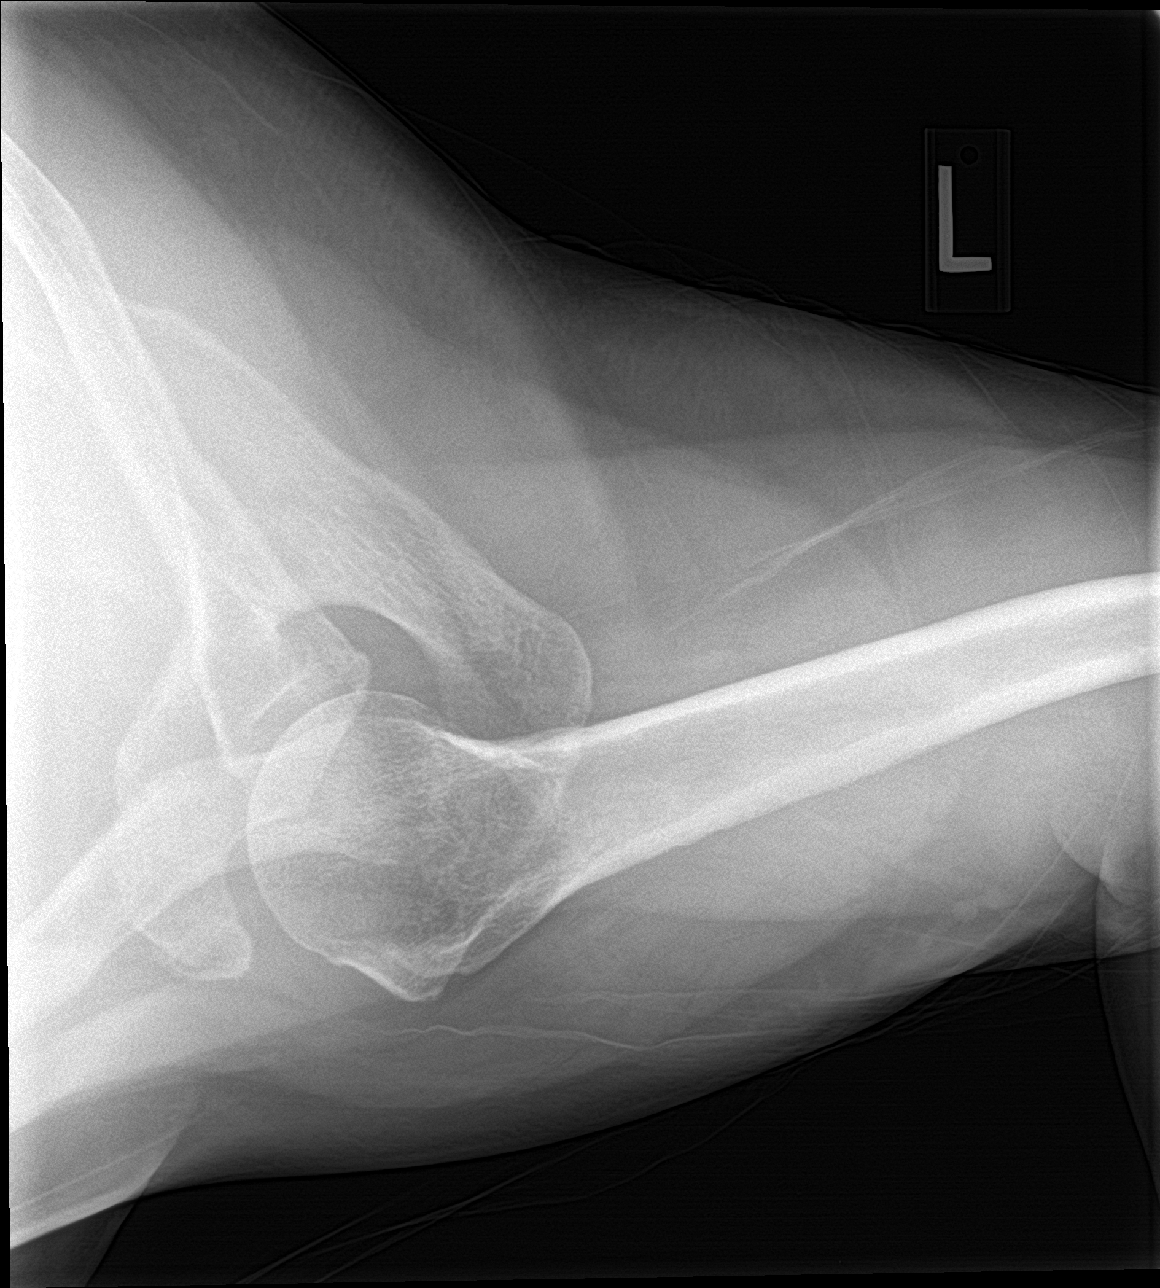

[3 of 3 positions shown; findings below may reference images not displayed]

FINDINGS: Normal alignment with approximation of the joints. No fracture or
focal osseous lesion. Mild acromioclavicular degenerative spurring.
Soft tissues are within normal limits.
IMPRESSION: No acute osseous abnormality.

Mild acromioclavicular osteoarthrosis.

## 2022-06-16 ENCOUNTER — Encounter: Payer: Self-pay | Admitting: Family Medicine

## 2022-06-16 ENCOUNTER — Ambulatory Visit (INDEPENDENT_AMBULATORY_CARE_PROVIDER_SITE_OTHER): Payer: Medicare Other | Admitting: Family Medicine

## 2022-06-16 VITALS — BP 106/73 | HR 78 | Ht 70.0 in | Wt 231.0 lb

## 2022-06-16 DIAGNOSIS — Z87891 Personal history of nicotine dependence: Secondary | ICD-10-CM

## 2022-06-16 DIAGNOSIS — Z125 Encounter for screening for malignant neoplasm of prostate: Secondary | ICD-10-CM

## 2022-06-16 DIAGNOSIS — I4510 Unspecified right bundle-branch block: Secondary | ICD-10-CM

## 2022-06-16 DIAGNOSIS — R9431 Abnormal electrocardiogram [ECG] [EKG]: Secondary | ICD-10-CM

## 2022-06-16 DIAGNOSIS — Z0000 Encounter for general adult medical examination without abnormal findings: Secondary | ICD-10-CM

## 2022-06-16 DIAGNOSIS — E782 Mixed hyperlipidemia: Secondary | ICD-10-CM | POA: Diagnosis not present

## 2022-06-16 DIAGNOSIS — I700 Atherosclerosis of aorta: Secondary | ICD-10-CM

## 2022-06-16 NOTE — Progress Notes (Signed)
Subjective:   Luis Mullins is a 67 y.o. male who presents for a Welcome to Medicare exam.  He feels like he is tolerating the Crestor much better than the simvastatin.  He is actually been taking it nightly instead of every other day.  Due to recheck labs.  Review of Systems: Neg ROS       Objective:    Today's Vitals   06/16/22 1337  BP: 106/73  Pulse: 78  SpO2: 95%  Weight: 231 lb (104.8 kg)  Height: 5\' 10"  (1.778 m)   Body mass index is 33.15 kg/m.  Medications Outpatient Encounter Medications as of 06/16/2022  Medication Sig   AMBULATORY NON FORMULARY MEDICATION Medication Name: df   clotrimazole-betamethasone (LOTRISONE) cream Apply 1 application topically 2 (two) times daily.   Investigational - Study Medication Study name:  Additional study details:   STUDY BEACON alendronate 70 mg or placebo capsule   rosuvastatin (CRESTOR) 5 MG tablet Take 1 tablet (5 mg total) by mouth at bedtime.   sildenafil (REVATIO) 20 MG tablet Take 2-4 tablets (40-80 mg total) by mouth daily as needed.   [DISCONTINUED] lidocaine (XYLOCAINE) 5 % ointment Apply 1 Application topically 3 (three) times daily as needed.   [DISCONTINUED] valACYclovir (VALTREX) 1000 MG tablet Take 1 tablet (1,000 mg total) by mouth 3 (three) times daily. For 7 days.   No facility-administered encounter medications on file as of 06/16/2022.     History: Past Medical History:  Diagnosis Date   Allergy    Vertigo    Past Surgical History:  Procedure Laterality Date   APPENDECTOMY  6   SCAR REVISION     TONSILLECTOMY     age 66    Family History  Problem Relation Age of Onset   Hypercholesterolemia Mother    Hearing loss Mother    Hypercholesterolemia Father    Alcohol abuse Father    Hearing loss Father    Heart attack Maternal Grandfather    Prostate cancer Paternal Uncle    Cancer Maternal Grandmother    Cancer Paternal Grandmother    Social History   Occupational History   Occupation:  retired  Tobacco Use   Smoking status: Former    Packs/day: 1.50    Years: 30.00    Additional pack years: 0.00    Total pack years: 45.00    Types: Cigarettes    Quit date: 01/13/2010    Years since quitting: 12.4   Smokeless tobacco: Never  Vaping Use   Vaping Use: Never used  Substance and Sexual Activity   Alcohol use: Never   Drug use: Never   Sexual activity: Yes    Partners: Female    Birth control/protection: None   Tobacco Counseling Counseling given: Not Answered   Immunizations and Health Maintenance Immunization History  Administered Date(s) Administered   Hep A / Hep B 07/04/2019, 07/11/2019, 07/25/2019, 07/09/2020   Influenza Inj Mdck Quad With Preservative 10/28/2014   Influenza Split 10/28/2010   Influenza, Seasonal, Injecte, Preservative Fre 11/02/2015, 10/28/2016   Influenza,inj,Quad PF,6+ Mos 10/28/2014, 10/24/2019, 11/29/2020   Influenza-Unspecified 10/20/2017, 10/19/2018, 11/20/2021   Moderna Sars-Covid-2 Vaccination 03/17/2019, 04/14/2019, 11/22/2019, 08/03/2020, 10/26/2021   PNEUMOCOCCAL CONJUGATE-20 12/11/2021   Td 10/03/2015   Tdap 03/05/2005   Typhoid Inactivated 07/27/2019   Health Maintenance Due  Topic Date Due   Lung Cancer Screening  Never done   COVID-19 Vaccine (6 - 2023-24 season) 12/21/2021    Activities of Daily Living    06/12/2022  3:11 PM  In your present state of health, do you have any difficulty performing the following activities:  Hearing? 0  Vision? 0  Difficulty concentrating or making decisions? 0  Walking or climbing stairs? 0  Dressing or bathing? 0  Doing errands, shopping? 0  Preparing Food and eating ? N  Using the Toilet? N  In the past six months, have you accidently leaked urine? Y  Do you have problems with loss of bowel control? N  Managing your Medications? N  Managing your Finances? N  Housekeeping or managing your Housekeeping? N    Physical Exam  Physical Exam Constitutional:       Appearance: He is well-developed.  HENT:     Head: Normocephalic and atraumatic.  Cardiovascular:     Rate and Rhythm: Normal rate and regular rhythm.     Heart sounds: Normal heart sounds.  Pulmonary:     Effort: Pulmonary effort is normal.     Breath sounds: Normal breath sounds.  Skin:    General: Skin is warm and dry.  Neurological:     Mental Status: He is alert and oriented to person, place, and time.  Psychiatric:        Behavior: Behavior normal.    (optional), or other factors deemed appropriate based on the beneficiary's medical and social history and current clinical standards.  Advanced Directives: Does Patient Have a Medical Advance Directive?: Yes Type of Advance Directive: Living will, Healthcare Power of Attorney Does patient want to make changes to medical advance directive?: No - Patient declined    Assessment:    This is a routine wellness  examination for this patient .   Vision/Hearing screen No results found.  Dietary issues and exercise activities discussed:  Current Exercise Habits: Home exercise routine, Type of exercise: walking;Other - see comments (yardwork), Time (Minutes): 35, Frequency (Times/Week): 6, Weekly Exercise (Minutes/Week): 210, Intensity: Moderate, Exercise limited by: None identified   Goals      Exercise 150 min/wk Moderate Activity     Keep up the exercise for 30 minutes a day that is fantastic!        Depression Screen    06/16/2022    1:36 PM 02/14/2021   11:12 AM 07/09/2020    2:15 PM 06/27/2019    3:14 PM  PHQ 2/9 Scores  PHQ - 2 Score 0 2 0 0  PHQ- 9 Score  2       Fall Risk    06/16/2022    1:36 PM  Fall Risk   Falls in the past year? 0  Number falls in past yr: 0  Injury with Fall? 0  Risk for fall due to : No Fall Risks  Follow up Falls evaluation completed    Cognitive Function        Patient Care Team: Agapito Games, MD as PCP - General (Family Medicine)     Plan:   Welcome to  Medicare  EKG shows rate of 83 bpm, normal sinus rhythm.  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  I do not have a copy of an old EKG but there was an on emergency department note from 2016 that noted some right bundle branch block.  I have personally reviewed and noted the following in the patient's chart:   Medical and social history Use of alcohol, tobacco or illicit drugs  Current medications and supplements Functional ability and status Nutritional status Physical activity Advanced directives List of  other physicians Hospitalizations, surgeries, and ER visits in previous 12 months Vitals Screenings to include cognitive, depression, and falls Referrals and appointments  In addition, I have reviewed and discussed with patient certain preventive protocols, quality metrics, and best practice recommendations. A written personalized care plan for preventive services as well as general preventive health recommendations were provided to patient.    Nani Gasser, MD 06/16/2022   Orders Placed This Encounter  Procedures   COMPLETE METABOLIC PANEL WITH GFR   Lipid Panel w/reflex Direct LDL   Ambulatory referral to Cardiology    Referral Priority:   Routine    Referral Type:   Consultation    Referral Reason:   Specialty Services Required    Number of Visits Requested:   1   Ambulatory Referral for Lung Cancer Scre    Referral Priority:   Routine    Referral Type:   Consultation    Referral Reason:   Specialty Services Required    Number of Visits Requested:   1   EKG 12-Lead   No orders of the defined types were placed in this encounter.

## 2022-06-16 NOTE — Progress Notes (Signed)
Pt is fasting today, he has lost 21 lbs!!! since his last visit 03/26/22,

## 2022-06-17 LAB — COMPLETE METABOLIC PANEL WITH GFR
AG Ratio: 1.9 (calc) (ref 1.0–2.5)
ALT: 16 U/L (ref 9–46)
AST: 14 U/L (ref 10–35)
Albumin: 4.5 g/dL (ref 3.6–5.1)
Alkaline phosphatase (APISO): 45 U/L (ref 35–144)
BUN: 13 mg/dL (ref 7–25)
CO2: 25 mmol/L (ref 20–32)
Calcium: 9.4 mg/dL (ref 8.6–10.3)
Chloride: 104 mmol/L (ref 98–110)
Creat: 0.73 mg/dL (ref 0.70–1.35)
Globulin: 2.4 g/dL (calc) (ref 1.9–3.7)
Glucose, Bld: 83 mg/dL (ref 65–99)
Potassium: 4 mmol/L (ref 3.5–5.3)
Sodium: 140 mmol/L (ref 135–146)
Total Bilirubin: 0.6 mg/dL (ref 0.2–1.2)
Total Protein: 6.9 g/dL (ref 6.1–8.1)
eGFR: 101 mL/min/{1.73_m2} (ref >=60)

## 2022-06-17 LAB — LIPID PANEL W/REFLEX DIRECT LDL
Cholesterol: 156 mg/dL (ref <200)
HDL: 38 mg/dL — ABNORMAL LOW (ref >=40)
LDL Cholesterol (Calc): 88 mg/dL (calc)
Non-HDL Cholesterol (Calc): 118 mg/dL (calc) (ref <130)
Total CHOL/HDL Ratio: 4.1 (calc) (ref <5.0)
Triglycerides: 198 mg/dL — ABNORMAL HIGH (ref <150)

## 2022-06-17 NOTE — Progress Notes (Signed)
Hi Luis Mullins, LDL cholesterol looks great and is under 100 which is fantastic.  That is where we want it to be.  Keep up current regimen.  Metabolic panel looks great.

## 2022-07-01 ENCOUNTER — Encounter: Payer: Self-pay | Admitting: Internal Medicine

## 2022-07-01 ENCOUNTER — Ambulatory Visit: Payer: Medicare Other | Attending: Internal Medicine | Admitting: Internal Medicine

## 2022-07-01 VITALS — BP 112/80 | HR 76 | Ht 70.0 in | Wt 234.0 lb

## 2022-07-01 DIAGNOSIS — E782 Mixed hyperlipidemia: Secondary | ICD-10-CM | POA: Diagnosis not present

## 2022-07-01 DIAGNOSIS — Z87891 Personal history of nicotine dependence: Secondary | ICD-10-CM

## 2022-07-01 DIAGNOSIS — I4510 Unspecified right bundle-branch block: Secondary | ICD-10-CM

## 2022-07-01 DIAGNOSIS — Z79899 Other long term (current) drug therapy: Secondary | ICD-10-CM

## 2022-07-01 NOTE — Patient Instructions (Signed)
Medication Instructions:  Your physician recommends that you continue on your current medications as directed. Please refer to the Current Medication list given to you today.  *If you need a refill on your cardiac medications before your next appointment, please call your pharmacy*   Lab Work: 6 MONTHS: Fasting lipid panel and Lipoprotein A (nothing to eat or drink 8 hours before except water)   If you have labs (blood work) drawn today and your tests are completely normal, you will receive your results only by: MyChart Message (if you have MyChart) OR A paper copy in the mail If you have any lab test that is abnormal or we need to change your treatment, we will call you to review the results.   Testing/Procedures: NONE   Follow-Up: At [facility], you and your health needs are our priority.  As part of our continuing mission to provide you with exceptional heart care, we have created designated Provider Care Teams.  These Care Teams include your primary Cardiologist (physician) and Advanced Practice Providers (APPs -  Physician Assistants and Nurse Practitioners) who all work together to provide you with the care you need, when you need it.   Your next appointment:   1 year(s)  Provider:   Christell Constant, MD
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
## 2022-07-14 ENCOUNTER — Encounter: Payer: Self-pay | Admitting: Family Medicine

## 2022-08-07 ENCOUNTER — Encounter: Payer: Self-pay | Admitting: Family Medicine

## 2022-08-07 ENCOUNTER — Ambulatory Visit (INDEPENDENT_AMBULATORY_CARE_PROVIDER_SITE_OTHER): Payer: Medicare Other | Admitting: Family Medicine

## 2022-08-07 VITALS — BP 99/64 | HR 75 | Resp 16 | Ht 70.5 in | Wt 232.1 lb

## 2022-08-07 DIAGNOSIS — Z87891 Personal history of nicotine dependence: Secondary | ICD-10-CM | POA: Diagnosis not present

## 2022-08-07 NOTE — Progress Notes (Signed)
   Established Patient Office Visit  Subjective   Patient ID: Luis Mullins, male    DOB: Oct 03, 1955  Age: 67 y.o. MRN: [id number]  Chief Complaint  Patient presents with   lung Cancer Screening    HPI  Here today to discuss lung cancer screening we have placed a referral and he did hear from them but had some questions and wanted to know more about the process.  Also he is continuing to work on his healthy diet and regular exercise he has lost about 2 more pounds but does feel like he has plateaued a little bit.  He does do a lot of yard work on the weekends but because of the heat lately he has not been outside as much.  He started smoking around age 57 and then quit in 2012 he smoked anywhere between 1 pack and 1-1/2 packs/day.  I also referred him to cardiology when he was last here for abnormal EKG.  He did see cardiology in June.    ROS    Objective:     BP 99/64 (BP Location: Left Arm, Patient Position: Sitting, Cuff Size: Large)   Pulse 75   Resp 16   Ht 5' 10.5" (1.791 m)   Wt 232 lb 1.3 oz (105.3 kg)   SpO2 95%   BMI 32.83 kg/m    Physical Exam Vitals reviewed.  Constitutional:      Appearance: He is well-developed.  HENT:     Head: Normocephalic and atraumatic.  Eyes:     Conjunctiva/sclera: Conjunctivae normal.  Cardiovascular:     Rate and Rhythm: Normal rate.  Pulmonary:     Effort: Pulmonary effort is normal.  Skin:    General: Skin is dry.     Coloration: Skin is not pale.  Neurological:     Mental Status: He is alert and oriented to person, place, and time.  Psychiatric:        Behavior: Behavior normal.      No results found for any visits on 08/07/22.    The 10-year ASCVD risk score (Arnett DK, et al., 2019) is: 8.2%    Assessment & Plan:   Problem List Items Addressed This Visit   None Visit Diagnoses     Former smoker    -  Primary     Former  smoker-discussed lung cancer screening.  He is still doing great with  weight loss just encouraged him to work on increasing his exercise/activity level.  No follow-ups on file.    Nani Gasser, MD  I spent 10 minutes on the day of the encounter to include pre-visit record review, face-to-face time with the patient and post visit ordering of test.

## 2022-08-23 ENCOUNTER — Other Ambulatory Visit: Payer: Self-pay | Admitting: Family Medicine

## 2022-08-23 DIAGNOSIS — E782 Mixed hyperlipidemia: Secondary | ICD-10-CM

## 2022-10-06 ENCOUNTER — Encounter: Payer: Self-pay | Admitting: Family Medicine

## 2022-10-06 DIAGNOSIS — Z23 Encounter for immunization: Secondary | ICD-10-CM | POA: Diagnosis not present

## 2022-12-18 ENCOUNTER — Encounter: Payer: Self-pay | Admitting: Family Medicine

## 2022-12-19 ENCOUNTER — Encounter: Payer: Self-pay | Admitting: *Deleted

## 2022-12-19 ENCOUNTER — Encounter: Payer: Self-pay | Admitting: Internal Medicine

## 2022-12-19 DIAGNOSIS — E782 Mixed hyperlipidemia: Secondary | ICD-10-CM

## 2022-12-19 MED ORDER — CLOTRIMAZOLE-BETAMETHASONE 1-0.05 % EX CREA: 1.0000 | TOPICAL_CREAM | Freq: Two times a day (BID) | CUTANEOUS | 1 refills | Status: AC

## 2022-12-19 NOTE — Telephone Encounter (Signed)
Patient is requesting med refill for Lotrisone. Last refill was 2 years ago. Is med refill appropriate? Please advise, thanks.

## 2022-12-22 ENCOUNTER — Ambulatory Visit: Payer: Medicare Other

## 2022-12-22 DIAGNOSIS — Z87891 Personal history of nicotine dependence: Secondary | ICD-10-CM

## 2022-12-22 DIAGNOSIS — I4510 Unspecified right bundle-branch block: Secondary | ICD-10-CM

## 2022-12-22 DIAGNOSIS — E782 Mixed hyperlipidemia: Secondary | ICD-10-CM

## 2022-12-22 DIAGNOSIS — Z79899 Other long term (current) drug therapy: Secondary | ICD-10-CM

## 2022-12-23 DIAGNOSIS — E782 Mixed hyperlipidemia: Secondary | ICD-10-CM | POA: Diagnosis not present

## 2022-12-25 LAB — LIPID PANEL
Chol/HDL Ratio: 3.7 {ratio} (ref 0.0–5.0)
Cholesterol, Total: 138 mg/dL (ref 100–199)
HDL: 37 mg/dL — ABNORMAL LOW (ref >39)
LDL Chol Calc (NIH): 79 mg/dL (ref 0–99)
Triglycerides: 123 mg/dL (ref 0–149)
VLDL Cholesterol Cal: 22 mg/dL (ref 5–40)

## 2022-12-25 LAB — LIPOPROTEIN A (LPA)

## 2023-01-29 ENCOUNTER — Other Ambulatory Visit: Payer: Self-pay

## 2023-01-29 ENCOUNTER — Telehealth: Payer: Self-pay

## 2023-01-29 DIAGNOSIS — Z122 Encounter for screening for malignant neoplasm of respiratory organs: Secondary | ICD-10-CM

## 2023-01-29 DIAGNOSIS — Z87891 Personal history of nicotine dependence: Secondary | ICD-10-CM

## 2023-01-29 NOTE — Telephone Encounter (Signed)
.  Lung Cancer Screening Narrative/Criteria Questionnaire (Cigarette Smokers Only- No Cigars/Pipes/vapes)   Luis Mullins   SDMV:02/09/2023 at 10:00am with Darla Lesches, RN   09-16-1955   LDCT: 02/12/2023 at 10:30 @ [facility].     68 y.o.   Phone: [phone]  Lung Screening Narrative (confirm age 90-77 yrs Medicare / 50-80 yrs Private pay insurance)   Insurance information:Medicare/[employer] [place]   Referring Provider:[place]   This screening involves an initial phone call with a team member from our program. It is called a shared decision making visit. The initial meeting is required by  insurance and Medicare to make sure you understand the program. This appointment takes about 15-20 minutes to complete. You will complete the screening scan at your scheduled date/time.  This scan takes about 5-10 minutes to complete. You can eat and drink normally before and after the scan.  Criteria questions for Lung Cancer Screening:   Are you a current or former smoker? Current Age began smoking: 15   If you are a former smoker, what year did you quit smoking? 2015 (within 15 yrs)   To calculate your smoking history, I need an accurate estimate of how many packs of cigarettes you smoked per day and for how many years. (Not just the number of PPD you are now smoking)   Years smoking 52 x Packs per day 1.5 = Pack years 78   (at least 20 pack yrs)   (Make sure they understand that we need to know how much they have smoked in the past, not just the number of PPD they are smoking now)  Do you have a personal history of cancer?  No    Do you have a family history of cancer? Yes  (cancer type and and relative) Both grandmothers had cancer. One colon and other unknown.   Are you coughing up blood?  No  Have you had unexplained weight loss of 15 lbs or more in the last 6 months? No  It looks like you meet all criteria.  When would be a good time for [place] to schedule you for this  screening?   Additional information:

## 2023-02-09 ENCOUNTER — Encounter: Payer: Self-pay | Admitting: Adult Health

## 2023-02-09 ENCOUNTER — Ambulatory Visit (INDEPENDENT_AMBULATORY_CARE_PROVIDER_SITE_OTHER): Payer: Medicare Other | Admitting: Adult Health

## 2023-02-09 DIAGNOSIS — Z87891 Personal history of nicotine dependence: Secondary | ICD-10-CM

## 2023-02-09 NOTE — Patient Instructions (Signed)

## 2023-02-09 NOTE — Progress Notes (Signed)
  Virtual Visit via Telephone Note  I connected with Luis Mullins , 02/09/23 10:17 AM by a telemedicine application and verified that I am speaking with the correct person using two identifiers.  Location: Patient: home Provider: home   I discussed the limitations of evaluation and management by telemedicine and the availability of in person appointments. The patient expressed understanding and agreed to proceed.   Shared Decision Making Visit Lung Cancer Screening Program [phone])   Eligibility: 68 y.o. Pack Years Smoking History Calculation = 78 pack years (# packs/per year x # years smoked) Recent History of coughing up blood  no Unexplained weight loss? no ( >Than 15 pounds within the last 6 months ) Prior History Lung / other cancer no (Diagnosis within the last 5 years already requiring surveillance chest CT Scans). Smoking Status Former Smoker Former Smokers: Years since quit: 10 years  Quit Date: 2015  Visit Components: Discussion included one or more decision making aids. YES Discussion included risk/benefits of screening. YES Discussion included potential follow up diagnostic testing for abnormal scans. YES Discussion included meaning and risk of over diagnosis. YES Discussion included meaning and risk of False Positives. YES Discussion included meaning of total radiation exposure. YES  Counseling Included: Importance of adherence to annual lung cancer LDCT screening. YES Impact of comorbidities on ability to participate in the program. YES Ability and willingness to under diagnostic treatment. YES  Smoking Cessation Counseling: Former Smokers:  Discussed the importance of maintaining cigarette abstinence. yes Diagnosis Code: Personal History of Nicotine Dependence. A21.308 Information about tobacco cessation classes and interventions provided to patient. Yes Patient provided with "ticket" for LDCT Scan. yes Written Order for Lung Cancer Screening with LDCT  placed in Epic. Yes (CT Chest Lung Cancer Screening Low Dose W/O CM) [id number]  Z12.2-Screening of respiratory organs Z87.891-Personal history of nicotine dependence   [name] 02/09/23

## 2023-02-12 ENCOUNTER — Ambulatory Visit: Payer: Medicare Other

## 2023-02-12 DIAGNOSIS — Z122 Encounter for screening for malignant neoplasm of respiratory organs: Secondary | ICD-10-CM | POA: Diagnosis not present

## 2023-02-12 DIAGNOSIS — Z87891 Personal history of nicotine dependence: Secondary | ICD-10-CM | POA: Diagnosis not present

## 2023-02-19 ENCOUNTER — Other Ambulatory Visit: Payer: Self-pay | Admitting: Family Medicine

## 2023-02-19 DIAGNOSIS — E782 Mixed hyperlipidemia: Secondary | ICD-10-CM

## 2023-02-23 ENCOUNTER — Telehealth: Payer: Self-pay | Admitting: Acute Care

## 2023-02-23 NOTE — Telephone Encounter (Signed)
 Tiffany calling with call report. For CT scan.Tiffany phone number is [phone].

## 2023-02-23 NOTE — Telephone Encounter (Signed)
 Confirmed call report:  IMPRESSION: 1. Lung-RADS 4A, suspicious. Follow up low-dose chest CT without contrast in 3 months (please use the following order, "CT CHEST LCS NODULE FOLLOW-UP W/O CM") is recommended. Solid 9.5 mm anterior right middle lobe pulmonary nodule along the minor fissure. 2. Two-vessel coronary atherosclerosis. 3. Aortic Atherosclerosis (ICD10-I70.0) and Emphysema (ICD10-J43.9).

## 2023-02-25 ENCOUNTER — Telehealth: Payer: Self-pay

## 2023-02-25 NOTE — Telephone Encounter (Signed)
Please call patient and review results. Recent Lung CT results reviewed by Alexandria Lodge, NP. This is the first LCS CT and a nodule of 9.5 mm is noted. Repeat scan is needed in 3 months and is due 05/13/2023. Please fax results and plan to PCP. Please place 3 month order.

## 2023-02-26 NOTE — Telephone Encounter (Signed)
Called and spoke with patient. Advises he is driving and request call back in one hour. I will call him again later today.

## 2023-03-02 ENCOUNTER — Other Ambulatory Visit: Payer: Self-pay

## 2023-03-02 DIAGNOSIS — F17210 Nicotine dependence, cigarettes, uncomplicated: Secondary | ICD-10-CM

## 2023-03-02 DIAGNOSIS — Z87891 Personal history of nicotine dependence: Secondary | ICD-10-CM

## 2023-03-02 DIAGNOSIS — Z122 Encounter for screening for malignant neoplasm of respiratory organs: Secondary | ICD-10-CM

## 2023-03-02 DIAGNOSIS — R911 Solitary pulmonary nodule: Secondary | ICD-10-CM

## 2023-03-02 NOTE — Telephone Encounter (Signed)
 Spoke with patient and reviewed results. Advised 9.5 mm nodule seen and 3 month f/u scan suggested. Patient is in agreement with plan. 3 month order placed and scheduled for 05/14/2023 at 11:00 am at [facility]. Reviewed aortic atherosclerosis, coronary atherosclerosis and emphysema. No questions. Results and plan to PCP.

## 2023-05-14 ENCOUNTER — Ambulatory Visit: Payer: Medicare Other

## 2023-05-14 DIAGNOSIS — Z87891 Personal history of nicotine dependence: Secondary | ICD-10-CM

## 2023-05-14 DIAGNOSIS — R911 Solitary pulmonary nodule: Secondary | ICD-10-CM

## 2023-05-14 DIAGNOSIS — J439 Emphysema, unspecified: Secondary | ICD-10-CM | POA: Diagnosis not present

## 2023-05-14 DIAGNOSIS — I700 Atherosclerosis of aorta: Secondary | ICD-10-CM

## 2023-05-14 DIAGNOSIS — F17210 Nicotine dependence, cigarettes, uncomplicated: Secondary | ICD-10-CM

## 2023-05-14 DIAGNOSIS — J432 Centrilobular emphysema: Secondary | ICD-10-CM | POA: Diagnosis not present

## 2023-05-14 DIAGNOSIS — Z122 Encounter for screening for malignant neoplasm of respiratory organs: Secondary | ICD-10-CM

## 2023-05-14 DIAGNOSIS — R918 Other nonspecific abnormal finding of lung field: Secondary | ICD-10-CM | POA: Diagnosis not present

## 2023-05-29 ENCOUNTER — Telehealth: Payer: Self-pay | Admitting: Family Medicine

## 2023-05-29 ENCOUNTER — Telehealth: Payer: Self-pay | Admitting: Acute Care

## 2023-05-29 ENCOUNTER — Other Ambulatory Visit: Payer: Self-pay

## 2023-05-29 DIAGNOSIS — R911 Solitary pulmonary nodule: Secondary | ICD-10-CM

## 2023-05-29 NOTE — Telephone Encounter (Signed)
 Spoke with patient by phone to review results of recent LDCT.  Nodule of interest is noted as stable with recommendation to repeat LDCT in 6 months, due to size remains over 9mm.  Emphysema and atherosclerosis as previously noted.  Nodule likely benign but recommend to follow again sooner than a year as precaution to ensure stability.  Patient agrees and acknowledges understanding.  Appointment for follow up LDCT scheduled.  Patient had no further questions

## 2023-05-29 NOTE — Telephone Encounter (Signed)
 Call report received:  IMPRESSION: 1. Stable 9.7 mm nodule along the minor fissure. Lung-RADS 3, probably benign findings. Short-term follow-up in 6 months is recommended with repeat low-dose chest CT without contrast (please use the following order, "CT CHEST LCS NODULE FOLLOW-UP W/O CM"). These results will be called to the ordering clinician or representative by the Radiologist Assistant, and communication documented in the PACS or [employer]. 2. Aortic atherosclerosis (ICD10-I70.0). Left main coronary artery calcification. 3.  Emphysema (ICD10-J43.9).

## 2023-05-29 NOTE — Telephone Encounter (Signed)
 Can we call and see if the imaging department can expedite the reading of his low-dose CAT scan it was a 29-month follow-up for lung nodule.  Thank you so much.

## 2023-06-01 NOTE — Telephone Encounter (Signed)
 See other telephone note from 05/29/2023

## 2023-06-25 ENCOUNTER — Encounter: Payer: Self-pay | Admitting: Family Medicine

## 2023-06-25 ENCOUNTER — Ambulatory Visit (INDEPENDENT_AMBULATORY_CARE_PROVIDER_SITE_OTHER): Admitting: Family Medicine

## 2023-06-25 VITALS — BP 119/76 | HR 71 | Ht 70.5 in | Wt 258.9 lb

## 2023-06-25 DIAGNOSIS — E559 Vitamin D deficiency, unspecified: Secondary | ICD-10-CM

## 2023-06-25 DIAGNOSIS — Z0000 Encounter for general adult medical examination without abnormal findings: Secondary | ICD-10-CM

## 2023-06-25 DIAGNOSIS — I700 Atherosclerosis of aorta: Secondary | ICD-10-CM

## 2023-06-25 DIAGNOSIS — E782 Mixed hyperlipidemia: Secondary | ICD-10-CM | POA: Diagnosis not present
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
## 2023-06-25 NOTE — Patient Instructions (Signed)
  Mr. Luis Mullins , Thank you for taking time to come for your Medicare Wellness Visit. I appreciate your ongoing commitment to your health goals. Please review the following plan we discussed and let me know if I can assist you in the future.   These are the goals we discussed:  Goals      Exercise 150 min/wk Moderate Activity     Keep up the exercise for 30 minutes a day that is fantastic!        This is a list of the screening recommended for you and due dates:  Health Maintenance  Topic Date Due   Zoster (Shingles) Vaccine (1 of 2) Never done   COVID-19 Vaccine (7 - 2024-25 season) 12/01/2022   Flu Shot  08/14/2023   Screening for Lung Cancer  05/13/2024   Medicare Annual Wellness Visit  06/24/2024   DTaP/Tdap/Td vaccine (3 - Td or Tdap) 10/02/2025   Colon Cancer Screening  04/08/2027   Pneumococcal Vaccine for age over 33  Completed   Hepatitis C Screening  Completed   HPV Vaccine  Aged Out   Meningitis B Vaccine  Aged Out

## 2023-06-26 ENCOUNTER — Encounter: Payer: Self-pay | Admitting: Family Medicine

## 2023-06-26 ENCOUNTER — Ambulatory Visit: Payer: Self-pay | Admitting: Family Medicine

## 2023-06-26 LAB — CBC WITH DIFFERENTIAL/PLATELET
Basophils Absolute: 0.1 10*3/uL (ref 0.0–0.2)
Basos: 1 %
EOS (ABSOLUTE): 0.2 10*3/uL (ref 0.0–0.4)
Eos: 3 %
Hematocrit: 48 % (ref 37.5–51.0)
Hemoglobin: 15.4 g/dL (ref 13.0–17.7)
Immature Grans (Abs): 0 10*3/uL (ref 0.0–0.1)
Immature Granulocytes: 0 %
Lymphocytes Absolute: 1.7 10*3/uL (ref 0.7–3.1)
Lymphs: 22 %
MCH: 28.9 pg (ref 26.6–33.0)
MCHC: 32.1 g/dL (ref 31.5–35.7)
MCV: 90 fL (ref 79–97)
Monocytes Absolute: 0.7 10*3/uL (ref 0.1–0.9)
Monocytes: 10 %
Neutrophils Absolute: 4.8 10*3/uL (ref 1.4–7.0)
Neutrophils: 64 %
Platelets: 209 10*3/uL (ref 150–450)
RBC: 5.33 x10E6/uL (ref 4.14–5.80)
RDW: 13.4 % (ref 11.6–15.4)
WBC: 7.5 10*3/uL (ref 3.4–10.8)

## 2023-06-26 LAB — LIPID PANEL WITH LDL/HDL RATIO
Cholesterol, Total: 155 mg/dL (ref 100–199)
HDL: 39 mg/dL — ABNORMAL LOW (ref >39)
LDL Chol Calc (NIH): 84 mg/dL (ref 0–99)
LDL/HDL Ratio: 2.2 ratio (ref 0.0–3.6)
Triglycerides: 189 mg/dL — ABNORMAL HIGH (ref 0–149)
VLDL Cholesterol Cal: 32 mg/dL (ref 5–40)

## 2023-06-26 LAB — CMP14+EGFR
ALT: 22 IU/L (ref 0–44)
AST: 18 IU/L (ref 0–40)
Albumin: 4.3 g/dL (ref 3.9–4.9)
Alkaline Phosphatase: 59 IU/L (ref 44–121)
BUN/Creatinine Ratio: 19 (ref 10–24)
BUN: 15 mg/dL (ref 8–27)
Bilirubin Total: 0.5 mg/dL (ref 0.0–1.2)
CO2: 21 mmol/L (ref 20–29)
Calcium: 9.5 mg/dL (ref 8.6–10.2)
Chloride: 102 mmol/L (ref 96–106)
Creatinine, Ser: 0.8 mg/dL (ref 0.76–1.27)
Globulin, Total: 2.5 g/dL (ref 1.5–4.5)
Glucose: 102 mg/dL — ABNORMAL HIGH (ref 70–99)
Potassium: 4.4 mmol/L (ref 3.5–5.2)
Sodium: 141 mmol/L (ref 134–144)
Total Protein: 6.8 g/dL (ref 6.0–8.5)
eGFR: 98 mL/min/{1.73_m2} (ref >59)

## 2023-06-26 LAB — VITAMIN D 25 HYDROXY (VIT D DEFICIENCY, FRACTURES): Vit D, 25-Hydroxy: 40.1 ng/mL (ref 30.0–100.0)

## 2023-06-26 NOTE — Progress Notes (Signed)
 Blood count look good.

## 2023-06-26 NOTE — Progress Notes (Signed)
 Hi Luis Mullins, LDL cholesterol is under 100 but ideally we would like it under 70.  We could opt to bump up your Crestor  to 10 mg.  If you are okay with that please let me know.  Exercise and diet is helpful as well.  Your HDL is on the low end of normal it has been up into the 40s before so just continue to work on healthy diet and regular exercise it does make a difference.  Triglycerides just slightly elevated.  Metabolic panel overall looks good.  Vitamin D is normal but it still a little bit on the lower end I would recommend taking 1000 IU daily.  Blood count still pending.  The 10-year ASCVD risk score (Arnett DK, et al., 2019) is: 11.8%   Values used to calculate the score:     Age: 68 years     Clincally relevant sex: Male     Is Non-Hispanic African American: No     Diabetic: No     Tobacco smoker: No     Systolic Blood Pressure: 119 mmHg     Is BP treated: No     HDL Cholesterol: 39 mg/dL     Total Cholesterol: 155 mg/dL

## 2023-06-27 ENCOUNTER — Encounter: Payer: Self-pay | Admitting: Family Medicine

## 2023-06-29 NOTE — Telephone Encounter (Signed)
Added to Immunization history

## 2023-08-09 ENCOUNTER — Other Ambulatory Visit: Payer: Self-pay | Admitting: Family Medicine

## 2023-08-09 DIAGNOSIS — E782 Mixed hyperlipidemia: Secondary | ICD-10-CM

## 2023-09-20 ENCOUNTER — Encounter: Payer: Self-pay | Admitting: Family Medicine

## 2023-11-16 ENCOUNTER — Ambulatory Visit

## 2023-11-16 DIAGNOSIS — R911 Solitary pulmonary nodule: Secondary | ICD-10-CM

## 2023-11-16 DIAGNOSIS — I700 Atherosclerosis of aorta: Secondary | ICD-10-CM | POA: Diagnosis not present

## 2023-11-16 DIAGNOSIS — J432 Centrilobular emphysema: Secondary | ICD-10-CM | POA: Diagnosis not present

## 2023-11-17 DIAGNOSIS — Z23 Encounter for immunization: Secondary | ICD-10-CM | POA: Diagnosis not present

## 2023-11-19 ENCOUNTER — Encounter: Payer: Self-pay | Admitting: Family Medicine

## 2023-11-23 ENCOUNTER — Other Ambulatory Visit: Payer: Self-pay

## 2023-11-23 DIAGNOSIS — Z122 Encounter for screening for malignant neoplasm of respiratory organs: Secondary | ICD-10-CM

## 2023-11-23 DIAGNOSIS — Z87891 Personal history of nicotine dependence: Secondary | ICD-10-CM

## 2024-06-23 ENCOUNTER — Encounter: Admitting: Family Medicine
